# Patient Record
Sex: Female | Born: 1965
Health system: Southern US, Community
[De-identification: ages and names within clinical notes are randomized; demographics above are authoritative.]

## PROBLEM LIST (undated history)

## (undated) DIAGNOSIS — R5383 Other fatigue: Secondary | ICD-10-CM

## (undated) DIAGNOSIS — M818 Other osteoporosis without current pathological fracture: Secondary | ICD-10-CM

## (undated) DIAGNOSIS — D4709 Other mast cell neoplasms of uncertain behavior: Secondary | ICD-10-CM

## (undated) DIAGNOSIS — F419 Anxiety disorder, unspecified: Secondary | ICD-10-CM

## (undated) DIAGNOSIS — R51 Headache: Secondary | ICD-10-CM

## (undated) DIAGNOSIS — Z87892 Personal history of anaphylaxis: Secondary | ICD-10-CM

## (undated) DIAGNOSIS — I7300 Raynaud's syndrome without gangrene: Secondary | ICD-10-CM

## (undated) DIAGNOSIS — Z720 Tobacco use: Secondary | ICD-10-CM

## (undated) DIAGNOSIS — Z9109 Other allergy status, other than to drugs and biological substances: Secondary | ICD-10-CM

## (undated) DIAGNOSIS — T387X5A Adverse effect of androgens and anabolic congeners, initial encounter: Secondary | ICD-10-CM

## (undated) DIAGNOSIS — L719 Rosacea, unspecified: Secondary | ICD-10-CM

## (undated) DIAGNOSIS — Z91038 Other insect allergy status: Secondary | ICD-10-CM

## (undated) DIAGNOSIS — K219 Gastro-esophageal reflux disease without esophagitis: Secondary | ICD-10-CM

## (undated) DIAGNOSIS — Z86010 Personal history of colonic polyps: Secondary | ICD-10-CM

## (undated) DIAGNOSIS — J45909 Unspecified asthma, uncomplicated: Secondary | ICD-10-CM

## (undated) DIAGNOSIS — K50814 Crohn's disease of both small and large intestine with abscess: Secondary | ICD-10-CM

## (undated) DIAGNOSIS — E039 Hypothyroidism, unspecified: Secondary | ICD-10-CM

## (undated) DIAGNOSIS — Z87440 Personal history of urinary (tract) infections: Secondary | ICD-10-CM

## (undated) HISTORY — DX: Other insect allergy status: Z91.038

## (undated) HISTORY — DX: Other fatigue: R53.83

## (undated) HISTORY — DX: Other osteoporosis without current pathological fracture: M81.8

## (undated) HISTORY — DX: Raynaud's syndrome without gangrene: I73.00

## (undated) HISTORY — DX: Personal history of colonic polyps: Z86.010

## (undated) HISTORY — DX: Anxiety disorder, unspecified: F41.9

## (undated) HISTORY — DX: Tobacco use: Z72.0

## (undated) HISTORY — DX: Unspecified asthma, uncomplicated: J45.909

## (undated) HISTORY — DX: Personal history of anaphylaxis: Z87.892

## (undated) HISTORY — DX: Headache: R51

## (undated) HISTORY — DX: Rosacea, unspecified: L71.9

## (undated) HISTORY — DX: Adverse effect of androgens and anabolic congeners, initial encounter: T38.7X5A

## (undated) HISTORY — DX: Crohn's disease of both small and large intestine with abscess: K50.814

## (undated) HISTORY — PX: SHOULDER SURGERY: SHX246

## (undated) HISTORY — DX: Personal history of urinary (tract) infections: Z87.440

## (undated) HISTORY — DX: Other allergy status, other than to drugs and biological substances: Z91.09

## (undated) HISTORY — DX: Hypothyroidism, unspecified: E03.9

---

## 1998-04-28 DIAGNOSIS — F418 Other specified anxiety disorders: Secondary | ICD-10-CM | POA: Insufficient documentation

## 1998-12-28 DIAGNOSIS — L409 Psoriasis, unspecified: Secondary | ICD-10-CM | POA: Insufficient documentation

## 2003-04-29 DIAGNOSIS — Z720 Tobacco use: Secondary | ICD-10-CM

## 2003-04-29 DIAGNOSIS — F17200 Nicotine dependence, unspecified, uncomplicated: Secondary | ICD-10-CM | POA: Insufficient documentation

## 2003-04-29 HISTORY — DX: Tobacco use: Z72.0

## 2005-04-28 DIAGNOSIS — K50814 Crohn's disease of both small and large intestine with abscess: Secondary | ICD-10-CM | POA: Insufficient documentation

## 2005-04-28 HISTORY — DX: Crohn's disease of both small and large intestine with abscess: K50.814

## 2006-02-23 DIAGNOSIS — J45909 Unspecified asthma, uncomplicated: Secondary | ICD-10-CM | POA: Insufficient documentation

## 2006-02-23 DIAGNOSIS — Z9109 Other allergy status, other than to drugs and biological substances: Secondary | ICD-10-CM | POA: Insufficient documentation

## 2006-02-23 DIAGNOSIS — M818 Other osteoporosis without current pathological fracture: Secondary | ICD-10-CM

## 2006-02-23 DIAGNOSIS — M810 Age-related osteoporosis without current pathological fracture: Secondary | ICD-10-CM | POA: Insufficient documentation

## 2006-02-23 HISTORY — DX: Other allergy status, other than to drugs and biological substances: Z91.09

## 2006-02-23 HISTORY — DX: Unspecified asthma, uncomplicated: J45.909

## 2006-02-23 HISTORY — DX: Other osteoporosis without current pathological fracture: M81.8

## 2008-12-27 DIAGNOSIS — M3500 Sicca syndrome, unspecified: Secondary | ICD-10-CM | POA: Insufficient documentation

## 2009-09-25 ENCOUNTER — Inpatient Hospital Stay: Payer: Self-pay | Admitting: Psychiatry

## 2009-11-12 ENCOUNTER — Ambulatory Visit (HOSPITAL_COMMUNITY): Admission: RE | Admit: 2009-11-12 | Discharge: 2009-11-12 | Payer: Self-pay | Admitting: Orthopaedic Surgery

## 2010-05-19 ENCOUNTER — Encounter: Payer: Self-pay | Admitting: Orthopaedic Surgery

## 2010-06-27 ENCOUNTER — Encounter (INDEPENDENT_AMBULATORY_CARE_PROVIDER_SITE_OTHER): Payer: Self-pay | Admitting: *Deleted

## 2010-07-04 NOTE — Letter (Signed)
Summary: New Patient letter  [facility]  [address] [place], [place] [address]   Phone: [phone]  Fax: [phone]       06/27/2010 MRN: [id number]  [facility] [address] [place], [place]  [address]  Dear Ms. Mayford Knife,  Welcome to the Gastroenterology Division at [facility].    You are scheduled to see Dr.  Marina Goodell on 08-07-10 at 1:45p.m. on the 3rd floor at [facility], [address]  We ask that you try to arrive at our office 15 minutes prior to your appointment time to allow for check-in.  We would like you to complete the enclosed self-administered evaluation form prior to your visit and bring it with you on the day of your appointment.  We will review it with you.  Also, please bring a complete list of all your medications or, if you prefer, bring the medication bottles and we will list them.  Please bring your insurance card so that we may make a copy of it.  If your insurance requires a referral to see a specialist, please bring your referral form from your primary care physician.  Co-payments are due at the time of your visit and may be paid by cash, check or credit card.     Your office visit will consist of a consult with your physician (includes a physical exam), any laboratory testing he/she may order, scheduling of any necessary diagnostic testing (e.g. x-ray, ultrasound, CT-scan), and scheduling of a procedure (e.g. Endoscopy, Colonoscopy) if required.  Please allow enough time on your schedule to allow for any/all of these possibilities.    If you cannot keep your appointment, please call [phone] to cancel or reschedule prior to your appointment date.  This allows [place] the opportunity to schedule an appointment for another patient in need of care.  If you do not cancel or reschedule by 5 p.m. the business day prior to your appointment date, you will be charged a $50.00 late cancellation/no-show fee.    Thank you for  choosing [facility] for your medical needs.  We appreciate the opportunity to care for you.  Please visit [place] at our website  to learn more about our practice.                     Sincerely,                                                             The Gastroenterology Division

## 2010-08-07 ENCOUNTER — Ambulatory Visit: Payer: Self-pay | Admitting: Internal Medicine

## 2010-09-09 ENCOUNTER — Ambulatory Visit: Payer: Self-pay | Admitting: Internal Medicine

## 2010-10-24 ENCOUNTER — Ambulatory Visit: Payer: Self-pay | Admitting: Internal Medicine

## 2010-11-05 ENCOUNTER — Emergency Department: Payer: Self-pay | Admitting: Emergency Medicine

## 2010-12-19 ENCOUNTER — Encounter: Payer: Self-pay | Admitting: Internal Medicine

## 2010-12-19 ENCOUNTER — Telehealth: Payer: Self-pay | Admitting: Internal Medicine

## 2010-12-19 NOTE — Telephone Encounter (Signed)
Called pt. and informed of Dr. Blanch Media decision. Sch'd NP OV for 01-14-11 @ 3:30. Mailed paperwork.

## 2010-12-19 NOTE — Telephone Encounter (Signed)
Ok w/ me. Thanks for checking with me first

## 2011-01-14 ENCOUNTER — Ambulatory Visit (INDEPENDENT_AMBULATORY_CARE_PROVIDER_SITE_OTHER): Payer: 59 | Admitting: Internal Medicine

## 2011-01-14 ENCOUNTER — Encounter: Payer: Self-pay | Admitting: Internal Medicine

## 2011-01-14 DIAGNOSIS — K589 Irritable bowel syndrome without diarrhea: Secondary | ICD-10-CM

## 2011-01-14 DIAGNOSIS — Z1211 Encounter for screening for malignant neoplasm of colon: Secondary | ICD-10-CM

## 2011-01-14 DIAGNOSIS — Z86010 Personal history of colonic polyps: Secondary | ICD-10-CM

## 2011-01-14 MED ORDER — PEG-KCL-NACL-NASULF-NA ASC-C 100 G PO SOLR: 1.0000 | Freq: Once | ORAL | Status: DC

## 2011-01-14 NOTE — Progress Notes (Signed)
HISTORY OF PRESENT ILLNESS:  Dawn [name] is a 45 y.o. female who presents today to establish her GI care. Her family physician, is Dr. Mila Merry in [place]. She is referred to me by a mutual patient, and her work Animator, Mickeal Needy. Patient's previous GI care was provided in [facility]. As well, she reports having had visits to [facility] and the [facility]. Unfortunately, no medical records. Her story is a bit unusual. She tells me that she has been diagnosed with food allergies about 15 years ago. These food allergies result in rash and a myriad of GI symptoms and at one point "caused adhesions that almost needed surgery". She also tells me that she has been diagnosed with ulcerative colitis, though she is on no chronic IBD meds, and irritable bowel syndrome. Does tell me that she has taken prednisone sporadically, but fairly frequently over the past 15 years. Currently on 5 mg of prednisone daily. Finally, she mentions that she underwent colonoscopy and upper endoscopy about 15 years ago. Colonoscopy revealed "polyps" for which it was recommended that she undergo colonoscopy YEARLY. These exams were apparently being performed in [place]. She denies having had multiple polyps, a malignant polyp, or a family history of colon cancer in a first degree relative. She reports a significant cancer history and her family including gallbladder cancer in her mother, prostate cancer in her father, and colon cancer in a grandmother. Her visits to [facility] and the [facility] did not elucidate additional specific diagnoses. Her recollection of those evaluations is vague. She tells that she had difficulties with conscious sedation being inadequate. Her last colonoscopy was apparently 5 years ago. She states she is overdue for followup. Thankfully, no active GI complaints at this time. She has not had anaphylaxis related to foods.  REVIEW OF SYSTEMS:  All non-GI ROS negative except for headaches,  skin rash, food intolerance, excessive urination, excessive urinary frequency history of UTI  Past Medical History  Diagnosis Date  . Anxiety   . Asthma   . Hx of colonic polyps   . Ulcerative colitis   . Hx: UTI (urinary tract infection)     Past Surgical History  Procedure Date  . Shoulder surgery     Social History Vasilisa Vore  reports that she has quit smoking. She has never used smokeless tobacco. She reports that she does not drink alcohol or use illicit drugs.  family history includes Cancer in her mother; Colon cancer in her paternal grandmother; Heart disease in her father; Irritable bowel syndrome in her mother; and Prostate cancer in her father.  Allergies  Allergen Reactions  . Erythromycin   . Other     ANTICHOLINERGIC MEDICATIONS   . Penicillins   . Sugar-Protein-Starch   . Sulfa Drugs Cross Reactors        PHYSICAL EXAMINATION:Vital signs: BP 122/74  Pulse 60  Ht 5\' 5"  (1.651 m)  Wt 156 lb (70.761 kg)  BMI 25.96 kg/m2  Constitutional: generally well-appearing, no acute distress Psychiatric: alert and oriented x3, cooperative Eyes: extraocular movements intact, anicteric, conjunctiva pink Mouth: oral pharynx moist, no lesions Neck: supple no lymphadenopathy Cardiovascular: heart regular rate and rhythm, no murmur Lungs: clear to auscultation bilaterally Abdomen: soft, nontender, nondistended, no obvious ascites, no peritoneal signs, normal bowel sounds, no organomegaly Rectal: Deferred until colonoscopy Extremities: no lower extremity edema bilaterally Skin: no lesions on visible extremities Neuro: No focal deficits. No asterixis.    ASSESSMENT:  #1. Prior history of colon polyps. Type  unspecified. Last examination 5 years ago. Apparently due for followup #2. Failed moderate sedation previously #3. Question IBD #4. Question IBS   PLAN:  #1. Obtain outside records for review. Patient has signed a waiver #2. Surveillance colonoscopy.The  nature of the procedure, as well as the risks, benefits, and alternatives were carefully and thoroughly reviewed with the patient. Ample time for discussion and questions allowed. The patient understood, was satisfied, and agreed to proceed. Movi prep prescribed #3. Propofol sedation

## 2011-01-14 NOTE — Patient Instructions (Signed)
Colon LEC with Propoful 02/18/11 9:00 am arrive at 8:00 am  Moviprep sent to pharmacy Colon brochure given for you to read.

## 2011-02-18 ENCOUNTER — Encounter: Payer: 59 | Admitting: Internal Medicine

## 2013-05-31 LAB — LIPID PANEL
Cholesterol: 201 mg/dL — AB (ref 0–200)
HDL: 66 mg/dL (ref 35–70)
LDL Cholesterol: 108 mg/dL
TRIGLYCERIDES: 136 mg/dL (ref 40–160)

## 2013-08-09 ENCOUNTER — Institutional Professional Consult (permissible substitution): Payer: 59 | Admitting: Internal Medicine

## 2014-02-03 LAB — BASIC METABOLIC PANEL
BUN: 15 mg/dL (ref 4–21)
Creatinine: 0.7 mg/dL (ref 0.5–1.1)
Glucose: 90 mg/dL
POTASSIUM: 4.7 mmol/L (ref 3.4–5.3)
SODIUM: 136 mmol/L — AB (ref 137–147)

## 2014-02-03 LAB — CBC AND DIFFERENTIAL: WBC: 10 10*3/mL

## 2014-02-03 LAB — HEPATIC FUNCTION PANEL
ALT: 9 U/L (ref 7–35)
AST: 18 U/L (ref 13–35)

## 2014-02-03 LAB — TSH: TSH: 6.7 u[IU]/mL — AB (ref 0.41–5.90)

## 2014-02-23 ENCOUNTER — Encounter (INDEPENDENT_AMBULATORY_CARE_PROVIDER_SITE_OTHER): Payer: Self-pay

## 2014-02-23 ENCOUNTER — Ambulatory Visit (INDEPENDENT_AMBULATORY_CARE_PROVIDER_SITE_OTHER)
Admission: RE | Admit: 2014-02-23 | Discharge: 2014-02-23 | Disposition: A | Discharge status: Home / Self Care | Payer: BC Managed Care – PPO | Source: Ambulatory Visit | Attending: Internal Medicine | Admitting: Internal Medicine

## 2014-02-23 ENCOUNTER — Encounter: Payer: Self-pay | Admitting: Internal Medicine

## 2014-02-23 ENCOUNTER — Other Ambulatory Visit (INDEPENDENT_AMBULATORY_CARE_PROVIDER_SITE_OTHER): Payer: BC Managed Care – PPO

## 2014-02-23 ENCOUNTER — Ambulatory Visit (INDEPENDENT_AMBULATORY_CARE_PROVIDER_SITE_OTHER): Payer: BC Managed Care – PPO | Admitting: Internal Medicine

## 2014-02-23 VITALS — BP 138/76 | HR 96 | Ht 66.0 in | Wt 158.0 lb

## 2014-02-23 DIAGNOSIS — J4541 Moderate persistent asthma with (acute) exacerbation: Secondary | ICD-10-CM

## 2014-02-23 DIAGNOSIS — Z91038 Other insect allergy status: Secondary | ICD-10-CM

## 2014-02-23 DIAGNOSIS — J4540 Moderate persistent asthma, uncomplicated: Secondary | ICD-10-CM

## 2014-02-23 LAB — CBC WITH DIFFERENTIAL/PLATELET
Basophils Absolute: 0.1 10*3/uL (ref 0.0–0.1)
Basophils Relative: 0.6 % (ref 0.0–3.0)
Eosinophils Absolute: 0.1 10*3/uL (ref 0.0–0.7)
Eosinophils Relative: 1.3 % (ref 0.0–5.0)
HEMATOCRIT: 39.2 % (ref 36.0–46.0)
HEMOGLOBIN: 12.8 g/dL (ref 12.0–15.0)
LYMPHS ABS: 3.2 10*3/uL (ref 0.7–4.0)
Lymphocytes Relative: 38.4 % (ref 12.0–46.0)
MCHC: 32.7 g/dL (ref 30.0–36.0)
MCV: 88 fl (ref 78.0–100.0)
MONOS PCT: 6.4 % (ref 3.0–12.0)
Monocytes Absolute: 0.5 10*3/uL (ref 0.1–1.0)
NEUTROS ABS: 4.5 10*3/uL (ref 1.4–7.7)
Neutrophils Relative %: 53.3 % (ref 43.0–77.0)
Platelets: 320 10*3/uL (ref 150.0–400.0)
RBC: 4.46 Mil/uL (ref 3.87–5.11)
RDW: 16.2 % — AB (ref 11.5–15.5)
WBC: 8.4 10*3/uL (ref 4.0–10.5)

## 2014-02-23 NOTE — Progress Notes (Signed)
02/23/14- 83 yoF former smokerFOLLOWS FOR: Patient here to establish care. Patient has occasional sob, wheezing, chest tightness.   Gives history of asthma since a teenager.no hospital stays. Treated in the past at [facility] in [place]. Wheezing and shortness of breath are intermittent. She blames living in a dusty house near a lot of woods. History of osteopenia so she wants to minimize steroids. Allergy skin testing in the past was positive for dust and pollens. She finds triggers also include exposure to cats and some dogs. History of anaphylaxis from a dilating eyedrops. Also history of strong insect venom systemic allergy so she carries an EpiPen . Some foods of caused hives in the past. Followed by GI in [place] for ulcerative colitis and irritable bowel disease treated with prednisone tapers. History of esophagitis without stricture or eosinophilic changes. She avoids pineapple, salmon, bananas Environment: House, 2 dogs, no smokers, no basement, carpeted, central air conditioning, electric heat, no mold.  Prior to Admission medications   Medication Sig Start Date End Date Taking? Authorizing Provider  albuterol (PROVENTIL HFA;VENTOLIN HFA) 108 (90 BASE) MCG/ACT inhaler Inhale 2 puffs into the lungs every 6 (six) hours as needed for wheezing or shortness of breath.   Yes Historical Provider, MD  CALCIUM-MAGNESIUM-VITAMIN D PO Take 1 capsule by mouth 2 (two) times daily.     Yes Historical Provider, MD  EPINEPHrine 0.3 mg/0.3 mL IJ SOAJ injection Inject 0.3 mg into the muscle as needed.   Yes Historical Provider, MD  LORazepam (ATIVAN) 0.5 MG tablet Take 0.5 mg by mouth 2 (two) times daily.     Yes Historical Provider, MD  predniSONE (DELTASONE) 5 MG tablet Take 5 mg by mouth daily.     Yes Historical Provider, MD   Past Medical History  Diagnosis Date  . Anxiety   . Asthma   . Hx of colonic polyps   . Ulcerative colitis   . Hx: UTI (urinary tract infection)    Past Surgical  History  Procedure Laterality Date  . Shoulder surgery     Family History  Problem Relation Age of Onset  . Colon cancer Paternal Grandmother   . Cancer Mother     gallbladder  . Irritable bowel syndrome Mother   . Prostate cancer Father   . Heart disease Father    History   Social History  . Marital Status: Married    Spouse Name: N/A    Number of Children: 0  . Years of Education: N/A   Occupational History  . Inpatient Coder    Social History Main Topics  . Smoking status: Former Smoker -- 0.30 packs/day for 10 years    Types: Cigarettes  . Smokeless tobacco: Never Used  . Alcohol Use: No  . Drug Use: No  . Sexual Activity: Not on file   Other Topics Concern  . Not on file   Social History Narrative   ROS-see HPI Constitutional:   No-   weight loss, night sweats, fevers, chills, fatigue, lassitude. HEENT:   +headaches, difficulty swallowing, tooth/dental problems, +sore throat,       No-  sneezing, +itching, ear ache, +nasal congestion, post nasal drip,  CV:  No-   chest pain, orthopnea, PND, swelling in lower extremities, anasarca,                                  dizziness, palpitations Resp: +shortness of breath with exertion or at  rest.              No-   productive cough,  No non-productive cough,  No- coughing up of blood.              No-   change in color of mucus.  No- wheezing.   Skin: No-   rash or lesions. GI:  No-   heartburn, indigestion, abdominal pain, nausea, vomiting, diarrhea,                 change in bowel habits, loss of appetite GU: No-   dysuria, change in color of urine, no urgency or frequency.  No- flank pain. MS:  No-   joint pain or swelling.  No- decreased range of motion.  No- back pain. Neuro-     nothing unusual Psych:  No- change in mood or affect. No depression or anxiety.  No memory loss.  OBJ- Physical Exam General- Alert, Oriented, Affect-appropriate, Distress- none acute Skin-keratosis Pilaris especially backs of  arms Lymphadenopathy- none Head- atraumatic            Eyes- Gross vision intact, PERRLA, conjunctivae and secretions clear            Ears- Hearing, canals-normal            Nose- Clear, no-Septal dev, mucus, polyps, erosion, perforation             Throat- Mallampati II , mucosa clear , drainage- none, tonsils- atrophic Neck- flexible , trachea midline, no stridor , thyroid nl, carotid no bruit Chest - symmetrical excursion , unlabored           Heart/CV- RRR , no murmur , no gallop  , no rub, nl s1 s2                           - JVD- none , edema- none, stasis changes- none, varices- none           Lung- clear to P&A, wheeze- none, cough- none , dullness-none, rub- none           Chest wall-  Abd- tender-no, distended-no, bowel sounds-present, HSM- no Br/ Gen/ Rectal- Not done, not indicated Extrem- cyanosis- none, clubbing, none, atrophy- none, strength- nl Neuro- grossly intact to observation

## 2014-02-23 NOTE — Patient Instructions (Signed)
Order- CXR   Dx Asthma moderate uncomplicated              Lab - CBC w diff, Allergy profile, Food IgE profile, alpha-gal IgE       Try a saline nasal gel and/ or saline spray as needed for nasal dryness

## 2014-02-24 LAB — ALLERGEN FOOD PROFILE SPECIFIC IGE
Apple: <0.1 kU/L
Chicken IgE: <0.1 kU/L
Egg White IgE: <0.1 kU/L
Fish Cod: <0.1 kU/L
IgE (Immunoglobulin E), Serum: 14 kU/L (ref <115)
Milk IgE: <0.1 kU/L
Orange: <0.1 kU/L
Peanut IgE: <0.1 kU/L
Shrimp IgE: <0.1 kU/L
Soybean IgE: <0.1 kU/L
Tomato IgE: <0.1 kU/L
Tuna IgE: <0.1 kU/L

## 2014-02-24 LAB — ALLERGY FULL PROFILE
ALTERNARIA ALTERNATA: 0.19 kU/L — AB
Allergen, D pternoyssinus,d7: 0.16 kU/L — ABNORMAL HIGH
Allergen,Goose feathers, e70: <0.1 kU/L
BAHIA GRASS: 1.24 kU/L — AB
Bermuda Grass: 0.72 kU/L — ABNORMAL HIGH
Box Elder IgE: <0.1 kU/L
CAT DANDER: 0.12 kU/L — AB
Candida Albicans: <0.1 kU/L
Common Ragweed: <0.1 kU/L
Curvularia lunata: <0.1 kU/L
D. farinae: 0.16 kU/L — ABNORMAL HIGH
Dog Dander: <0.1 kU/L
Elm IgE: <0.1 kU/L
Fescue: 0.81 kU/L — ABNORMAL HIGH
G005 Rye, Perennial: 0.78 kU/L — ABNORMAL HIGH
G009 Red Top: 0.97 kU/L — ABNORMAL HIGH
House Dust Hollister: <0.1 kU/L
Lamb's Quarters: <0.1 kU/L
Oak: <0.1 kU/L
Plantain: <0.1 kU/L
Stemphylium Botryosum: <0.1 kU/L
Sycamore Tree: <0.1 kU/L
Timothy Grass: 0.79 kU/L — ABNORMAL HIGH

## 2014-02-24 NOTE — Progress Notes (Signed)
Quick Note:  Called and spoke to pt. Informed pt of the results and recs per CY. Pt verbalized understanding and denied any further questions or concerns at this time. ______

## 2014-02-26 DIAGNOSIS — Z91038 Other insect allergy status: Secondary | ICD-10-CM | POA: Insufficient documentation

## 2014-02-26 DIAGNOSIS — J45909 Unspecified asthma, uncomplicated: Secondary | ICD-10-CM | POA: Insufficient documentation

## 2014-02-26 HISTORY — DX: Other insect allergy status: Z91.038

## 2014-02-26 HISTORY — DX: Unspecified asthma, uncomplicated: J45.909

## 2014-02-26 NOTE — Assessment & Plan Note (Signed)
Emphasized avoidance

## 2014-02-26 NOTE — Assessment & Plan Note (Signed)
Chronic persistent asthma, currently uncomplicated. We need to better understand the role of allergic triggers Plan-chest x-ray, allergy profiles, schedule PFT, CBC with differential

## 2014-03-02 LAB — GALACTOSE-ALPHA-1,3-GALACTOSE IGE

## 2014-05-01 ENCOUNTER — Other Ambulatory Visit: Payer: BLUE CROSS/BLUE SHIELD

## 2014-05-01 ENCOUNTER — Ambulatory Visit: Payer: BLUE CROSS/BLUE SHIELD | Admitting: Internal Medicine

## 2014-05-01 ENCOUNTER — Encounter: Payer: Self-pay | Admitting: Internal Medicine

## 2014-05-01 VITALS — BP 122/90 | HR 79 | Ht 65.0 in | Wt 158.2 lb

## 2014-05-01 DIAGNOSIS — K51919 Ulcerative colitis, unspecified with unspecified complications: Secondary | ICD-10-CM

## 2014-05-01 DIAGNOSIS — J45909 Unspecified asthma, uncomplicated: Secondary | ICD-10-CM

## 2014-05-01 NOTE — Progress Notes (Signed)
02/23/14- 64 yoF former smokerFOLLOWS FOR: Patient here to establish care. Patient has occasional sob, wheezing, chest tightness.   Gives history of asthma since a teenager.no hospital stays. Treated in the past at [facility] in [place]. Wheezing and shortness of breath are intermittent. She blames living in a dusty house near a lot of woods. History of osteopenia so she wants to minimize steroids. Allergy skin testing in the past was positive for dust and pollens. She finds triggers also include exposure to cats and some dogs. History of anaphylaxis from a dilating eyedrops. Also history of strong insect venom systemic allergy so she carries an EpiPen . Some foods of caused hives in the past. Followed by GI in [place] for ulcerative colitis and irritable bowel disease treated with prednisone tapers. History of esophagitis without stricture or eosinophilic changes. She avoids pineapple, salmon, bananas Environment: House, 2 dogs, no smokers, no basement, carpeted, central air conditioning, electric heat, no mold.  05/01/13- 71 yoF former smoker followed for asthma/ COPD FOLLOWS FOR: Pt states that she had one flare with weather change- stable after increasing Prednisone(x1 day).  CXR 02/23/14 IMPRESSION: No acute abnormalities. Probable mild pectus excavatum. Electronically Signed  By: Lavonia Dana M.D.  On: 02/23/2014 17:16  ROS-see HPI Constitutional:   No-   weight loss, night sweats, fevers, chills, fatigue, lassitude. HEENT:   +headaches, difficulty swallowing, tooth/dental problems, +sore throat,       No-  sneezing, +itching, ear ache, +nasal congestion, post nasal drip,  CV:  No-   chest pain, orthopnea, PND, swelling in lower extremities, anasarca,                                  dizziness, palpitations Resp: +shortness of breath with exertion or at rest.              No-   productive cough,  No non-productive cough,  No- coughing up of blood.              No-   change  in color of mucus.  No- wheezing.   Skin: No-   rash or lesions. GI:  No-   heartburn, indigestion, abdominal pain, nausea, vomiting, diarrhea,                 change in bowel habits, loss of appetite GU: No-   dysuria, change in color of urine, no urgency or frequency.  No- flank pain. MS:  No-   joint pain or swelling.  No- decreased range of motion.  No- back pain. Neuro-     nothing unusual Psych:  No- change in mood or affect. No depression or anxiety.  No memory loss.  OBJ- Physical Exam General- Alert, Oriented, Affect-appropriate, Distress- none acute Skin-keratosis Pilaris especially backs of arms Lymphadenopathy- none Head- atraumatic            Eyes- Gross vision intact, PERRLA, conjunctivae and secretions clear            Ears- Hearing, canals-normal            Nose- Clear, no-Septal dev, mucus, polyps, erosion, perforation             Throat- Mallampati II , mucosa clear , drainage- none, tonsils- atrophic Neck- flexible , trachea midline, no stridor , thyroid nl, carotid no bruit Chest - symmetrical excursion , unlabored           Heart/CV- RRR ,  no murmur , no gallop  , no rub, nl s1 s2                           - JVD- none , edema- none, stasis changes- none, varices- none           Lung- clear to P&A, wheeze- none, cough- none , dullness-none, rub- none           Chest wall-  Abd- tender-no, distended-no, bowel sounds-present, HSM- no Br/ Gen/ Rectal- Not done, not indicated Extrem- cyanosis- none, clubbing, none, atrophy- none, strength- nl Neuro- grossly intact to observation

## 2014-05-01 NOTE — Patient Instructions (Signed)
Order-lab- Immunoglobulins  IgG, IgA, IgM      Dx asthma, colitis  We can talk more about Grasstek sublingual therapy for grass pollen allergy

## 2014-05-02 LAB — IGG, IGA, IGM
IgA: 178 mg/dL (ref 69–380)
IgG (Immunoglobin G), Serum: 1060 mg/dL (ref 690–1700)
IgM, Serum: 152 mg/dL (ref 52–322)

## 2014-07-13 ENCOUNTER — Ambulatory Visit: Payer: Self-pay | Admitting: Gastroenterology

## 2014-07-13 LAB — HM COLONOSCOPY

## 2014-07-31 ENCOUNTER — Ambulatory Visit: Payer: BLUE CROSS/BLUE SHIELD | Admitting: Internal Medicine

## 2014-11-24 ENCOUNTER — Other Ambulatory Visit: Payer: Self-pay | Admitting: Family Medicine

## 2014-11-24 NOTE — Telephone Encounter (Signed)
Pt contacted office for refill request on the following medications:  CALCIUM-MAGNESIUM-VITAMIN D PO. Ward Specality in [facility] [phone], Nicorette Lozenge Compound 32m MWolfhurstand  predniSONE (DELTASONE) 5 MG [facility].  [id number]

## 2014-11-29 MED ORDER — PREDNISONE 5 MG PO TABS: 5.0000 mg | ORAL_TABLET | Freq: Every day | ORAL | Status: DC

## 2014-11-29 NOTE — Telephone Encounter (Signed)
Pt is calling back to follow up on the Rx.  CB#[phone]/MW

## 2014-11-29 NOTE — Telephone Encounter (Signed)
rx for prednisone has been sent to [facility] Please call in prescription for calcium-magnesium-vitamin D. One tablet daily, #30, rf x 5 Nicorette lozenge compound 1mg  every 2 hours as needed, #240, rf x 5.

## 2014-11-30 NOTE — Telephone Encounter (Signed)
Rx phoned into pharmacy.

## 2014-12-11 ENCOUNTER — Encounter: Payer: Self-pay | Admitting: Family Medicine

## 2014-12-11 ENCOUNTER — Ambulatory Visit (INDEPENDENT_AMBULATORY_CARE_PROVIDER_SITE_OTHER): Payer: BLUE CROSS/BLUE SHIELD | Admitting: Family Medicine

## 2014-12-11 VITALS — BP 140/80 | HR 92 | Temp 98.6°F | Resp 16 | Wt 161.0 lb

## 2014-12-11 DIAGNOSIS — I7300 Raynaud's syndrome without gangrene: Secondary | ICD-10-CM | POA: Insufficient documentation

## 2014-12-11 DIAGNOSIS — R319 Hematuria, unspecified: Secondary | ICD-10-CM

## 2014-12-11 DIAGNOSIS — R5383 Other fatigue: Secondary | ICD-10-CM

## 2014-12-11 DIAGNOSIS — R531 Weakness: Secondary | ICD-10-CM | POA: Diagnosis not present

## 2014-12-11 DIAGNOSIS — Z87892 Personal history of anaphylaxis: Secondary | ICD-10-CM | POA: Insufficient documentation

## 2014-12-11 DIAGNOSIS — E039 Hypothyroidism, unspecified: Secondary | ICD-10-CM | POA: Insufficient documentation

## 2014-12-11 DIAGNOSIS — R51 Headache: Secondary | ICD-10-CM

## 2014-12-11 DIAGNOSIS — L719 Rosacea, unspecified: Secondary | ICD-10-CM

## 2014-12-11 DIAGNOSIS — R519 Headache, unspecified: Secondary | ICD-10-CM | POA: Insufficient documentation

## 2014-12-11 DIAGNOSIS — M2550 Pain in unspecified joint: Secondary | ICD-10-CM | POA: Insufficient documentation

## 2014-12-11 HISTORY — DX: Headache, unspecified: R51.9

## 2014-12-11 HISTORY — DX: Personal history of anaphylaxis: Z87.892

## 2014-12-11 HISTORY — DX: Other fatigue: R53.83

## 2014-12-11 HISTORY — DX: Raynaud's syndrome without gangrene: I73.00

## 2014-12-11 HISTORY — DX: Hypothyroidism, unspecified: E03.9

## 2014-12-11 HISTORY — DX: Rosacea, unspecified: L71.9

## 2014-12-11 NOTE — Progress Notes (Signed)
Patient: Dawn [name] Female    DOB: 04-30-1965   49 y.o.   MRN: [id number] Visit Date: 12/11/2014  Today's Provider: Lelon Huh, MD   Chief Complaint  Patient presents with  . Hematuria  . Referral    Neurology   Subjective:    Hematuria Irritative symptoms include frequency, nocturia and urgency. Obstructive symptoms do not include incomplete emptying. Associated symptoms include flank pain. Pertinent negatives include no abdominal pain or inability to urinate.   She states she has had several visit to [facility] over the last year with persistent hematuria. She presented to [facility] on several occasions with urinary frequency associate with lower back pain which is intermittent. She states there were leukocytes on u/a on some of those occasions, and that hematuria was always present. Symptoms sometimes resolve with antibiotics, but not always, and did not improve with clindamycin the last few two episodes which were in June and earlier this month. Urine was cultured one time which was negative. Has had no radiologic sudies. She was advised by provider at [facility] to have urology evaluation for persistent hematuria..     She aso reports she has had trouble with eye blurriness, ocular muscle weakness, blurry vision and floaters which is more prominent in left eye than right.  She states Dr. Dawna Part thought these symptoms may be due to neurological issue and recommend neurological evaluation. She also states she has numbness in hands, difficulty getting full breath, extreme weakness even just using a computer mouse makes her arms feel weak. Of interest is that Dr. Dawna Part also diagnosed with Sicca syndrome, and that she is being followed by Dr. Jefm Bryant for psoriatic arthritis and is apparently considering disease modifying drugs.    Patient Care Team    Relationship Specialty Notifications Start End  Birdie Sons, MD PCP - General Family Medicine  01/14/11    Comment: Nicholes Rough., MD Consulting Physician Rheumatology  12/11/14   Philis Kendall, MD Consulting Physician Ophthalmology  12/11/14     Past Medical History  Diagnosis Date  . Anxiety   . Asthma   . Hx of colonic polyps   . Ulcerative colitis   . Hx: UTI (urinary tract infection)    Patient Active Problem List   Diagnosis Date Noted  . Arthralgia 12/11/2014  . Headache 12/11/2014  . History of anaphylaxis 12/11/2014  . Acquired hypothyroidism 12/11/2014  . Raynaud's phenomenon 12/11/2014  . Rosacea 12/11/2014  . Fatigue 12/11/2014  . Asthma, chronic 02/26/2014  . History of insect sting allergy 02/26/2014  . Allergy to pollen 02/23/2006  . Osteoporosis due to androgen therapy 02/23/2006  . Asthma, exogenous 02/23/2006  . Crohn's disease of both small and large intestine with abscess 04/28/2005  . Tobacco abuse 04/29/2003  . Anxiety 04/28/1998     Allergies  Allergen Reactions  . Codeine   . Erythromycin   . Other     ANTICHOLINERGIC MEDICATIONS   . Oxycodone Other (See Comments)  . Oxycodone-Acetaminophen Other (See Comments)  . Penicillins   . Sugar-Protein-Starch   . Sulfa Drugs Cross Reactors    Previous Medications   ALBUTEROL (PROVENTIL HFA;VENTOLIN HFA) 108 (90 BASE) MCG/ACT INHALER    Inhale 2 puffs into the lungs every 6 (six) hours as needed for wheezing or shortness of breath.   CALCIUM-MAGNESIUM-VITAMIN D PO    Take 1 capsule by mouth 2 (two) times daily.     EPINEPHRINE (EPIPEN 2-PAK) 0.3 MG/0.3  ML IJ SOAJ INJECTION    Inject 1 Dose as directed. As needed for anaphylactic reactions   EPINEPHRINE 0.3 MG/0.3 ML IJ SOAJ INJECTION    Inject 0.3 mg into the muscle as needed.   GLUCOSE BLOOD TEST STRIP       LEVOTHYROXINE (SYNTHROID, LEVOTHROID) 50 MCG TABLET    Take 1 tablet by mouth daily.   LORAZEPAM (ATIVAN) 0.5 MG TABLET    Take 0.5 mg by mouth daily.    METRONIDAZOLE (METROGEL) 1 % GEL    Apply 1 application topically 2 (two) times daily.    MULTIPLE VITAMINS-MINERALS (MULTIVITAMIN ADULT PO)    Take 1 tablet by mouth daily.   NICOTINE POLACRILEX (NICORETTE) 2 MG LOZENGE    Take by mouth. (1mg  lozenge) take 1 every 2 hours as needed   PREDNISONE (DELTASONE) 5 MG TABLET    Take 1 tablet (5 mg total) by mouth daily.    Review of Systems  Respiratory: Negative for chest tightness, shortness of breath and wheezing.   Cardiovascular: Negative for chest pain and palpitations.  Gastrointestinal: Negative for abdominal pain.  Genitourinary: Positive for urgency, frequency, hematuria, flank pain and nocturia. Negative for incomplete emptying.  Musculoskeletal: Positive for back pain.  Psychiatric/Behavioral: Positive for sleep disturbance.    Social History  Substance Use Topics  . Smoking status: Former Smoker -- 0.30 packs/day for 10 years    Types: Cigarettes  . Smokeless tobacco: Never Used  . Alcohol Use: 0.0 oz/week    0 Standard drinks or equivalent per week     Comment: occasional   Objective:   BP 140/80 mmHg  Pulse 92  Temp(Src) 98.6 F (37 C) (Oral)  Resp 16  Wt 161 lb (73.029 kg)  SpO2 96%  Physical Exam  General appearance: alert, well developed, well nourished, cooperative and in no distress Head: Normocephalic, without obvious abnormality, atraumatic Lungs: Respirations even and unlabored Extremities: No gross deformities Skin: Skin color, texture, turgor normal. No rashes seen  Psych: Appropriate mood and affect. Neurologic: Mental status: Alert, oriented to person, place, and time, thought content appropriate.      Assessment & Plan:     1. Hematuria Persistent at multiple visits to [facility], and not clearly associated with UTIs. She requests referral to rheumatology  2. Weakness Associated with a number of non-specific neurological symptoms and with history of Sicca and psoriatic arthritis. She reports Dr. Dawna Part and Dr. Jefm Bryant recommend referral to neurology. Will obtain records and set up  referral.        Lelon Huh, MD  [facility]

## 2014-12-28 ENCOUNTER — Ambulatory Visit: Payer: BLUE CROSS/BLUE SHIELD | Admitting: Urology

## 2015-01-12 ENCOUNTER — Ambulatory Visit: Payer: BLUE CROSS/BLUE SHIELD

## 2015-01-16 ENCOUNTER — Encounter: Payer: Self-pay | Admitting: Obstetrics and Gynecology

## 2015-01-16 ENCOUNTER — Ambulatory Visit (INDEPENDENT_AMBULATORY_CARE_PROVIDER_SITE_OTHER): Payer: 59 | Admitting: Obstetrics and Gynecology

## 2015-01-16 VITALS — BP 124/87 | HR 72 | Ht 65.0 in | Wt 157.2 lb

## 2015-01-16 DIAGNOSIS — R310 Gross hematuria: Secondary | ICD-10-CM

## 2015-01-16 LAB — MICROSCOPIC EXAMINATION

## 2015-01-16 LAB — URINALYSIS, COMPLETE
Bilirubin, UA: NEGATIVE
Glucose, UA: NEGATIVE
Ketones, UA: NEGATIVE
NITRITE UA: NEGATIVE
PH UA: 6 (ref 5.0–7.5)
Protein, UA: NEGATIVE
Specific Gravity, UA: 1.01 (ref 1.005–1.030)
UUROB: 0.2 mg/dL (ref 0.2–1.0)

## 2015-01-16 NOTE — Progress Notes (Signed)
01/16/2015 8:02 AM   Dawn [name] 03-03-1966 [id number]  Referring provider: Birdie Sons, MD [address] [place], [place] [address]  Chief Complaint  Patient presents with  . Hematuria    new pt     HPI: Patient is a 49 year old female with a significant history of multiple autoimmune diseases including Crohn's disease psoriasis, Sjogens syndrome, and irritable bowel syndrome. She states that she is also currently undergoing a workup by rheumatology for other issues. She presents today as a [facility] for multiple episodes of microscopic hematuria. She also states that she has noticed some blood when she wipes after urinating.  He states that she has been treated multiple times with antibiotics for urinary tract infections but she is unsure cultures were ever since. She doesn't complain of frequent urination, urgency, urge incontinence and nocturia. He states that she does notice that the symptoms become worse when she is having flareups of her autoimmune diseases.  Patient has also been on prednisone for many years for management of her autoimmune disease. She she has developed subsequent osteopenia and takes high doses of calcium supplementation. She states that her primary care provider was concerned that she may be developing kidney stones as a result of high calcium intake.  She reports that she used to smoke for approximately 5 years but quit 20 years ago.  No history of renal stones.  PMH: Past Medical History  Diagnosis Date  . Anxiety   . Asthma   . Hx of colonic polyps   . Ulcerative colitis   . Hx: UTI (urinary tract infection)   . Asthma, chronic 02/26/2014    Onset as teenager   . Allergy to pollen 02/23/2006  . Asthma, exogenous 02/23/2006  . Crohn's disease of both small and large intestine with abscess 04/28/2005    (Ulcerative Colitis)   . Acquired hypothyroidism 12/11/2014  . Osteoporosis due to androgen therapy 02/23/2006  .  Rosacea 12/11/2014  . History of insect sting allergy 02/26/2014    Hymenoptera, carries EpiPen   . Headache 12/11/2014  . History of anaphylaxis 12/11/2014  . Raynaud's phenomenon 12/11/2014  . Tobacco abuse 04/29/2003  . Fatigue 12/11/2014    Surgical History: Past Surgical History  Procedure Laterality Date  . Shoulder surgery      Home Medications:    Medication List       This list is accurate as of: 01/16/15 11:59 PM.  Always use your most recent med list.               albuterol 108 (90 BASE) MCG/ACT inhaler  Commonly known as:  PROVENTIL HFA;VENTOLIN HFA  Inhale 2 puffs into the lungs every 6 (six) hours as needed for wheezing or shortness of breath.     CALCIUM-MAGNESIUM-VITAMIN D PO  Take 1 capsule by mouth 2 (two) times daily.     EPIPEN 2-PAK 0.3 mg/0.3 mL Soaj injection  Generic drug:  EPINEPHrine  Inject 1 Dose as directed. As needed for anaphylactic reactions     glucose blood test strip     levothyroxine 50 MCG tablet  Commonly known as:  SYNTHROID, LEVOTHROID  Take 1 tablet by mouth daily.     LORazepam 0.5 MG tablet  Commonly known as:  ATIVAN  Take 0.5 mg by mouth daily.     METROGEL 1 % gel  Generic drug:  metroNIDAZOLE  Apply 1 application topically 2 (two) times daily.     MULTIVITAMIN ADULT PO  Take  1 tablet by mouth daily.     NICORETTE 2 MG lozenge  Generic drug:  nicotine polacrilex  Take by mouth. (75m lozenge) take 1 every 2 hours as needed     OConsecoCalcium/Vitamin D 250-125 MG-UNIT Tabs     predniSONE 5 MG tablet  Commonly known as:  DELTASONE  Take 1 tablet (5 mg total) by mouth daily.        Allergies:  Allergies  Allergen Reactions  . Codeine   . Erythromycin   . Other     ANTICHOLINERGIC MEDICATIONS   . Oxycodone Other (See Comments)  . Oxycodone-Acetaminophen Other (See Comments) and Nausea And Vomiting  . Penicillins   . Sugar-Protein-Starch   . Sulfa Drugs Cross Reactors     Family History: Family  History  Problem Relation Age of Onset  . Colon cancer Paternal Grandmother   . Cancer Mother     gallbladder  . Irritable bowel syndrome Mother   . Prostate cancer Father   . Heart disease Father   . Renal cancer Maternal Grandmother     Social History:  reports that she quit smoking about 20 years ago. Her smoking use included Cigarettes. She has a 3 pack-year smoking history. She has never used smokeless tobacco. She reports that she drinks alcohol. She reports that she does not use illicit drugs.  ROS: UROLOGY Frequent Urination?: Yes Hard to postpone urination?: Yes Burning/pain with urination?: No Get up at night to urinate?: Yes Leakage of urine?: Yes Urine stream starts and stops?: No Trouble starting stream?: No Do you have to strain to urinate?: No Blood in urine?: Yes Urinary tract infection?: Yes Sexually transmitted disease?: No Injury to kidneys or bladder?: No Painful intercourse?: No Weak stream?: No Currently pregnant?: No Vaginal bleeding?: No Last menstrual period?: No  Gastrointestinal Nausea?: No Vomiting?: No Indigestion/heartburn?: No Diarrhea?: No Constipation?: No  Constitutional Fever: No Night sweats?: No Weight loss?: No Fatigue?: No  Skin Skin rash/lesions?: No  Eyes Blurred vision?: No Double vision?: No  Ears/Nose/Throat Sore throat?: No Sinus problems?: No  Hematologic/Lymphatic Swollen glands?: No Easy bruising?: No  Cardiovascular Leg swelling?: No Chest pain?: No  Respiratory Cough?: No Shortness of breath?: No  Endocrine Excessive thirst?: No  Musculoskeletal Back pain?: No Joint pain?: No  Neurological Headaches?: No Dizziness?: No  Psychologic Depression?: No Anxiety?: Yes  Physical Exam: BP 124/87 mmHg  Pulse 72  Ht 5' 5"  (1.651 m)  Wt 157 lb 3.2 oz (71.305 kg)  BMI 26.16 kg/m2  Constitutional:  Alert and oriented, No acute distress. HEENT: [place] AT, moist mucus membranes.  Trachea midline,  no masses. Cardiovascular: No clubbing, cyanosis, or edema. Respiratory: Normal respiratory effort, no increased work of breathing. GI: Abdomen is soft, nontender, nondistended, no abdominal masses GU: No CVA tenderness.  Skin: No rashes, bruises or suspicious lesions. Lymph: No cervical or inguinal adenopathy. Neurologic: Grossly intact, no focal deficits, moving all 4 extremities. Psychiatric: Normal mood and affect.  Laboratory Data: Results for orders placed or performed in visit on 01/16/15  Microscopic Examination  Result Value Ref Range   WBC, UA 6-10 (A) 0 -  5 /hpf   RBC, UA 0-2 0 -  2 /hpf   Epithelial Cells (non renal) 0-10 0 - 10 /hpf   Mucus, UA Present (A) Not Estab.   Bacteria, UA Moderate (A) None seen/Few  Urinalysis, Complete  Result Value Ref Range   Specific Gravity, UA 1.010 1.005 - 1.030   pH, UA  6.0 5.0 - 7.5   Color, UA Yellow Yellow   Appearance Ur Clear Clear   Leukocytes, UA 1+ (A) Negative   Protein, UA Negative Negative/Trace   Glucose, UA Negative Negative   Ketones, UA Negative Negative   RBC, UA 2+ (A) Negative   Bilirubin, UA Negative Negative   Urobilinogen, Ur 0.2 0.2 - 1.0 mg/dL   Nitrite, UA Negative Negative   Microscopic Examination See below:   Comprehensive metabolic panel  Result Value Ref Range   Glucose 80 65 - 99 mg/dL   BUN 9 6 - 24 mg/dL   Creatinine, Ser 0.68 0.57 - 1.00 mg/dL   GFR calc non Af Amer 102 >59 mL/min/1.73   GFR calc Af Amer 117 >59 mL/min/1.73   BUN/Creatinine Ratio 13 9 - 23   Sodium 139 134 - 144 mmol/L   Potassium 4.0 3.5 - 5.2 mmol/L   Chloride 97 97 - 108 mmol/L   CO2 25 18 - 29 mmol/L   Calcium 9.6 8.7 - 10.2 mg/dL   Total Protein 7.1 6.0 - 8.5 g/dL   Albumin 4.5 3.5 - 5.5 g/dL   Globulin, Total 2.6 1.5 - 4.5 g/dL   Albumin/Globulin Ratio 1.7 1.1 - 2.5   Bilirubin Total 0.6 0.0 - 1.2 mg/dL   Alkaline Phosphatase 57 39 - 117 IU/L   AST 18 0 - 40 IU/L   ALT 13 0 - 32 IU/L     Urinalysis     Component Value Date/Time   GLUCOSEU Negative 01/16/2015 1017   BILIRUBINUR Negative 01/16/2015 1017   NITRITE Negative 01/16/2015 1017   LEUKOCYTESUR 1+* 01/16/2015 1017    Pertinent Imaging:   Assessment & Plan:    1. Gross hematuria- Patient reports occasionally noticing blood when she wipes after urinating. She has noticed a small amount in the toilet. Patient also reports that she has been told on multiple occasions that she had blood in her urine when she was seen at  [facility]. We do not have notes available to [place] at this time. Considering possible gross hematuria. We discussed the differential diagnosis for gross and microscopic hematuria including nephrolithiasis, renal or upper tract tumors, bladder stones, UTIs, or bladder tumors as well as undetermined etiologies. Per AUA guidelines, I did recommend complete microscopic hematuria evaluation including CTU, possible urine cytology, and office cystoscopy. Patient understands and is in agreement with plan. - Urinalysis, Complete  2. Urinary Frequency- Patient states that symptoms significantly worsened when she is having "flareups" of her autoimmune diseases. She states that she does not wish to take any additional medications at this time. Urine is unremarkable today and I do not feel that these symptoms are related to infection. She reports very mild symptoms today.  Additional notes and/or imaging study requested from [facility]  Return for schedule cystoscopy; f/u visit for CT Urogram results.  These notes generated with voice recognition software. I apologize for typographical errors.  Herbert Moors, [facility] [address], [place], [place] [address] [phone]

## 2015-01-17 LAB — COMPREHENSIVE METABOLIC PANEL
ALBUMIN: 4.5 g/dL (ref 3.5–5.5)
ALK PHOS: 57 IU/L (ref 39–117)
ALT: 13 IU/L (ref 0–32)
AST: 18 IU/L (ref 0–40)
Albumin/Globulin Ratio: 1.7 (ref 1.1–2.5)
BUN / CREAT RATIO: 13 (ref 9–23)
BUN: 9 mg/dL (ref 6–24)
Bilirubin Total: 0.6 mg/dL (ref 0.0–1.2)
CALCIUM: 9.6 mg/dL (ref 8.7–10.2)
CO2: 25 mmol/L (ref 18–29)
CREATININE: 0.68 mg/dL (ref 0.57–1.00)
Chloride: 97 mmol/L (ref 97–108)
GFR calc Af Amer: 117 mL/min/{1.73_m2} (ref >59)
GFR, EST NON AFRICAN AMERICAN: 102 mL/min/{1.73_m2} (ref >59)
GLUCOSE: 80 mg/dL (ref 65–99)
Globulin, Total: 2.6 g/dL (ref 1.5–4.5)
Potassium: 4 mmol/L (ref 3.5–5.2)
Sodium: 139 mmol/L (ref 134–144)
Total Protein: 7.1 g/dL (ref 6.0–8.5)

## 2015-01-19 ENCOUNTER — Telehealth: Payer: Self-pay

## 2015-01-19 NOTE — Telephone Encounter (Signed)
-----   Message from Roda Shutters, [place] sent at 01/18/2015  1:25 PM EDT ----- Please notify patient that her blood work was normal. Thanks

## 2015-01-19 NOTE — Telephone Encounter (Signed)
Spoke with pt in reference to lab results. Pt voiced understanding.  

## 2015-02-14 ENCOUNTER — Other Ambulatory Visit: Payer: 59

## 2015-02-26 ENCOUNTER — Other Ambulatory Visit: Payer: 59

## 2015-03-14 ENCOUNTER — Ambulatory Visit: Payer: 59

## 2015-03-14 ENCOUNTER — Other Ambulatory Visit: Payer: Self-pay | Admitting: *Deleted

## 2015-03-20 ENCOUNTER — Other Ambulatory Visit: Payer: Self-pay | Admitting: Family Medicine

## 2015-03-20 NOTE — Telephone Encounter (Signed)
Pt needs refills .... nicotine polacrilex (NICORETTE) 1 MG lozenge 240 multiple refills she uses [facility] for this   Calcium Carb-Cholecalciferol (OYSTER SHELL CALCIUM/VITAMIN D) 250-125 MG-UNIT TABS 180 monthly  Multiple refills  She uses [facility] for this.  Their number is  [phone]  Patients Call back is [phone]  Thanks teri

## 2015-03-21 MED ORDER — OYSTER SHELL CALCIUM/VITAMIN D 250-125 MG-UNIT PO TABS: 1.0000 | ORAL_TABLET | Freq: Every day | ORAL | Status: DC

## 2015-03-21 NOTE — Telephone Encounter (Signed)
22m nicotine lozenges are not in EMR. Please call in rx for nicotine 176mlozenge, one every 2 hours as needed, #240, rf x 12

## 2015-04-25 ENCOUNTER — Other Ambulatory Visit: Payer: Self-pay | Admitting: Neurology

## 2015-04-25 DIAGNOSIS — H538 Other visual disturbances: Secondary | ICD-10-CM

## 2015-04-26 ENCOUNTER — Telehealth: Payer: Self-pay

## 2015-04-26 NOTE — Telephone Encounter (Signed)
Mickel Baas  From [facility] called stating that she is concerned that patient may be taking to may of the Nicotine Lozenges. The prescriptions has been written for a quantity of 240 which is a 20 day supply. Mickel Baas states that the patient has been calling requesting refills earlier and earlier each week. She is concerned that this is unsafe for the patient because she has been on these since 05/2013 and she feels that the patient should be weaned off instead of needing more. Mickel Baas wants to know if the Lozenges are supposed to last 1 month? If so she want Dr. Caryn Section to put something in the directions where it limits the amount patient is to take daily. Please call Mickel Baas back at [phone]. I advised Mickel Baas that you were out of the office until Tuesday and she says that's fine to wait on your response.

## 2015-05-15 ENCOUNTER — Telehealth: Payer: Self-pay

## 2015-05-15 NOTE — Telephone Encounter (Signed)
Please advise 

## 2015-05-15 NOTE — Telephone Encounter (Signed)
Called in Rx as listed below into pharmacy.

## 2015-05-15 NOTE — Telephone Encounter (Signed)
Patient is requesting a refill for Calcium 100 mg- Vitamin D 100 IU- Magnesium 100 mg be called in at [facility] in [place] [phone] (compounding dept). Added pharmacy in chart also.  Patient states she takes compound vitamin 2 capsules 3 times daily. CB# [phone]

## 2015-05-15 NOTE — Telephone Encounter (Signed)
Ok to call into pharmacy. Medication does not seem to be in Epic. Quantity of 180 and refill x 12

## 2015-05-17 ENCOUNTER — Ambulatory Visit
Admission: RE | Admit: 2015-05-17 | Discharge: 2015-05-17 | Disposition: A | Discharge status: Home / Self Care | Payer: 59 | Source: Ambulatory Visit | Attending: Neurology | Admitting: Neurology

## 2015-05-17 DIAGNOSIS — R531 Weakness: Secondary | ICD-10-CM | POA: Insufficient documentation

## 2015-05-17 DIAGNOSIS — H538 Other visual disturbances: Secondary | ICD-10-CM | POA: Insufficient documentation

## 2015-05-17 DIAGNOSIS — R1319 Other dysphagia: Secondary | ICD-10-CM | POA: Insufficient documentation

## 2015-05-17 DIAGNOSIS — R42 Dizziness and giddiness: Secondary | ICD-10-CM | POA: Diagnosis not present

## 2015-05-17 DIAGNOSIS — R51 Headache: Secondary | ICD-10-CM | POA: Insufficient documentation

## 2015-05-17 MED ORDER — GADOBENATE DIMEGLUMINE 529 MG/ML IV SOLN
15.0000 mL | Freq: Once | INTRAVENOUS | Status: AC | PRN
  Administered 2015-05-17: 14 mL via INTRAVENOUS

## 2015-06-13 HISTORY — PX: ELECTROMYOGRAPHY: SHX933

## 2015-06-14 ENCOUNTER — Encounter: Payer: Self-pay | Admitting: Family Medicine

## 2015-07-05 ENCOUNTER — Other Ambulatory Visit: Payer: Self-pay | Admitting: Neurology

## 2015-07-09 ENCOUNTER — Other Ambulatory Visit: Payer: Self-pay | Admitting: Neurology

## 2015-07-09 DIAGNOSIS — E329 Disease of thymus, unspecified: Secondary | ICD-10-CM

## 2015-07-11 ENCOUNTER — Ambulatory Visit
Admission: RE | Admit: 2015-07-11 | Discharge: 2015-07-11 | Disposition: A | Discharge status: Home / Self Care | Payer: 59 | Source: Ambulatory Visit | Attending: Neurology | Admitting: Neurology

## 2015-07-11 DIAGNOSIS — R918 Other nonspecific abnormal finding of lung field: Secondary | ICD-10-CM | POA: Diagnosis not present

## 2015-07-11 DIAGNOSIS — E329 Disease of thymus, unspecified: Secondary | ICD-10-CM | POA: Insufficient documentation

## 2015-08-16 ENCOUNTER — Telehealth: Payer: Self-pay | Admitting: Family Medicine

## 2015-08-16 NOTE — Telephone Encounter (Signed)
Pt contacted office for refill request on the following medications: 1. predniSONE (DELTASONE) 5 MG tablet to [facility] 2. nicotine polacrilex (NICORETTE) 1 MG lozenge to [facility]  Pt needs both of these medications sent to 2 different pharmacies. Please advise. Thanks TNP

## 2015-08-17 MED ORDER — PREDNISONE 5 MG PO TABS: 5.0000 mg | ORAL_TABLET | Freq: Every day | ORAL | Status: DC

## 2015-08-17 MED ORDER — NICOTINE POLACRILEX 2 MG MT LOZG: 2.0000 mg | LOZENGE | OROMUCOSAL | Status: DC | PRN

## 2015-08-17 NOTE — Addendum Note (Signed)
Addended by: Lelon Huh E on: 08/17/2015 11:09 AM   Modules accepted: Orders

## 2015-08-17 NOTE — Telephone Encounter (Signed)
Can you please call [facility] in [place] and cancel the prescription for nicotine patches. Was supposed to go to [facility] instead. Thanks.

## 2015-10-01 ENCOUNTER — Other Ambulatory Visit: Payer: Self-pay | Admitting: Family Medicine

## 2015-10-01 MED ORDER — ALBUTEROL SULFATE HFA 108 (90 BASE) MCG/ACT IN AERS: 2.0000 | INHALATION_SPRAY | Freq: Four times a day (QID) | RESPIRATORY_TRACT | Status: DC | PRN

## 2015-10-01 MED ORDER — EPINEPHRINE 0.3 MG/0.3ML IJ SOAJ: 0.3000 mg | Freq: Once | INTRAMUSCULAR | Status: DC

## 2015-10-01 NOTE — Telephone Encounter (Signed)
Pt needs refill on the following   EPINEPHrine (EPIPEN 2-PAK) 0.3 mg/0.3 mL IJ SOAJ injection predniSONE (DELTASONE) 5 MG tablet albuterol (PROVENTIL HFA;VENTOLIN HFA) 108 (90 BASE) MCG/ACT inhaler  She uses [facility] in [place]  Her call back is [phone]  Thanks Con Memos

## 2015-10-01 NOTE — Telephone Encounter (Signed)
Prednisone has #6 refills available at [facility].

## 2015-10-26 ENCOUNTER — Telehealth: Payer: Self-pay | Admitting: Family Medicine

## 2015-10-26 ENCOUNTER — Other Ambulatory Visit: Payer: Self-pay | Admitting: *Deleted

## 2015-10-26 ENCOUNTER — Other Ambulatory Visit: Payer: Self-pay | Admitting: Physician Assistant

## 2015-10-26 MED ORDER — NICOTINE POLACRILEX 2 MG MT LOZG: 2.0000 mg | LOZENGE | OROMUCOSAL | Status: DC | PRN

## 2015-10-26 NOTE — Telephone Encounter (Signed)
Pt stated that the medication RX was sent into [facility] today but it was supposed to be for 1 mg instead of 2 mg pt would like it sent to  [facility], [place] [phone] (Phone) [phone] (Fax)       Please advise. Thanks TNP

## 2015-10-26 NOTE — Telephone Encounter (Signed)
Done

## 2015-10-26 NOTE — Telephone Encounter (Signed)
Per Fenton Malling, ok to call [facility] and give rx for 1 mg Nicotine lozenge #240 x1.

## 2015-11-01 ENCOUNTER — Ambulatory Visit (INDEPENDENT_AMBULATORY_CARE_PROVIDER_SITE_OTHER): Payer: BLUE CROSS/BLUE SHIELD | Admitting: Family Medicine

## 2015-11-01 ENCOUNTER — Encounter: Payer: Self-pay | Admitting: Family Medicine

## 2015-11-01 VITALS — BP 110/74 | HR 80 | Temp 97.7°F | Resp 16 | Wt 141.0 lb

## 2015-11-01 DIAGNOSIS — R531 Weakness: Secondary | ICD-10-CM | POA: Diagnosis not present

## 2015-11-01 DIAGNOSIS — R51 Headache: Secondary | ICD-10-CM | POA: Diagnosis not present

## 2015-11-01 DIAGNOSIS — R519 Headache, unspecified: Secondary | ICD-10-CM

## 2015-11-01 DIAGNOSIS — F17299 Nicotine dependence, other tobacco product, with unspecified nicotine-induced disorders: Secondary | ICD-10-CM

## 2015-11-01 NOTE — Progress Notes (Signed)
Patient: Dawn [name] Female    DOB: 31-Aug-1965   50 y.o.   MRN: [id number] Visit Date: 11/01/2015  Today's Provider: Lelon Huh, MD   Chief Complaint  Patient presents with  . Headache  . Rash   Subjective:    HPI  Patient presents today requesting referral to Dr. Elmyra Ricks at [facility]. She has been seeing local neurologist Dr. Manuella Ghazi for constellation of symptoms including recurrent headache, dizziness, episodes of blurred vision, and lower extremity weakness. However she also has a number of suspected auto-immune conditions including Crohn's disease, SICCA syndrome, Sjogren's syndrome, recurrent hives, psoriatic arthritis, and findings of bilateral ground grass opacities on recent CT of her chest ordered by her pulmonologist, Dr. Raul Del. Her rheumatologist, Dr. Jefm Bryant has her on methotrexate.   Patient states she has discussed her conditions with her specialists and decided to see Dr. Gordy Levan because she has heard that Dr. Gordy Levan has experience with Mast cell dysfunction and may be able tie all of her chronic neurologic and rheumatologic conditions together. Patient's last MRI of brain was 05/27/2015 and was unremarkable. She is also followed by Dr. Allen Norris and has had several colonoscopies.    Allergies  Allergen Reactions  . Codeine   . Erythromycin   . Other     ANTICHOLINERGIC MEDICATIONS   . Oxycodone Other (See Comments)  . Oxycodone-Acetaminophen Other (See Comments) and Nausea And Vomiting  . Penicillins   . Sugar-Protein-Starch   . Sulfa Drugs Cross Reactors    Current Meds  Medication Sig  . albuterol (PROVENTIL HFA;VENTOLIN HFA) 108 (90 Base) MCG/ACT inhaler Inhale 2 puffs into the lungs every 6 (six) hours as needed for wheezing or shortness of breath.  Marland Kitchen CALCIUM-MAGNESIUM-VITAMIN D PO Take 1 capsule by mouth 2 (two) times daily.    Marland Kitchen EPINEPHrine (EPIPEN 2-PAK) 0.3 mg/0.3 mL IJ SOAJ injection Inject 0.3 mLs (0.3 mg total) into the skin once. As  needed for anaphylactic reactions  . glucose blood test strip   . levothyroxine (SYNTHROID, LEVOTHROID) 50 MCG tablet Take 1 tablet by mouth daily.  Marland Kitchen LORazepam (ATIVAN) 0.5 MG tablet Take 0.5 mg by mouth daily.   . Multiple Vitamins-Minerals (MULTIVITAMIN ADULT PO) Take 1 tablet by mouth daily.  . nicotine polacrilex 1 MG lozenge Take 1 mg every 2 hours by mouth as needed for smoking cessation.  . predniSONE (DELTASONE) 5 MG tablet Take 1 tablet (5 mg total) by mouth daily.    Review of Systems  Constitutional: Negative for fever, chills, appetite change and fatigue.  Respiratory: Negative for chest tightness and shortness of breath.   Cardiovascular: Negative for chest pain and palpitations.  Gastrointestinal: Negative for nausea, vomiting and abdominal pain.  Neurological: Negative for dizziness and weakness.    Social History  Substance Use Topics  . Smoking status: Former Smoker -- 0.30 packs/day for 10 years    Types: Cigarettes    Quit date: 01/16/1995  . Smokeless tobacco: Never Used  . Alcohol Use: 0.0 oz/week    0 Standard drinks or equivalent per week     Comment: occasional   Patient Active Problem List   Diagnosis Date Noted  . Bilateral headaches 11/01/2015  . Weakness 11/01/2015  . Arthralgia 12/11/2014  . Headache 12/11/2014  . History of anaphylaxis 12/11/2014  . Acquired hypothyroidism 12/11/2014  . Raynaud's phenomenon 12/11/2014  . Rosacea 12/11/2014  . Fatigue 12/11/2014  . Asthma, chronic 02/26/2014  . History of insect sting allergy 02/26/2014  .  Allergy to pollen 02/23/2006  . Osteoporosis due to androgen therapy 02/23/2006  . Asthma, exogenous 02/23/2006  . Crohn's disease of both small and large intestine with abscess ([place]) 04/28/2005  . Nicotine dependence 04/29/2003  . Anxiety 04/28/1998   Patient Care Team    Relationship Specialty Notifications Start End  Birdie Sons, MD PCP - General Family Medicine  01/14/11    Comment: Nicholes Rough., MD Consulting Physician Rheumatology  12/11/14   Philis Kendall, MD Consulting Physician Ophthalmology  12/11/14   Vladimir Crofts, MD  Neurology  11/04/15   Deneise Lever, MD Consulting Physician Pulmonary Disease  11/04/15   Lucilla Lame, MD Consulting Physician Gastroenterology  11/04/15     Objective:   BP 110/74 mmHg  Pulse 80  Temp(Src) 97.7 F (36.5 C) (Oral)  Resp 16  Wt 141 lb (63.957 kg)  SpO2 98%  Physical Exam   General Appearance:    Alert, cooperative, no distress  Eyes:    PERRL, conjunctiva/corneas clear, EOM's intact       Lungs:     Clear to auscultation bilaterally, respirations unlabored  Heart:    Regular rate and rhythm  Neurologic:   Awake, alert, oriented x 3. No apparent focal neurological           defect.               Assessment & Plan:     1. Bilateral headaches Associated with several suspected autoimmune diseases. Per patient request will refer Dr. Gordy Levan who she understands is familiar with mast cell dysfunction as discussed above.  - Ambulatory referral to Neurology. Dr Jackolyn Confer [facility]  2. Weakness  - Ambulatory referral to Neurology  3. Other tobacco product nicotine dependence with nicotine-induced disorder Refill Nicotine lozenges 1mg  every 2 hours as needed. Long discussion regarding potential adverse health effects of excessive nicotine consumption and she is going to limit to a few a day and work on weaning all together.         Lelon Huh, MD  [facility]

## 2015-11-02 DIAGNOSIS — R0609 Other forms of dyspnea: Secondary | ICD-10-CM | POA: Insufficient documentation

## 2016-01-14 ENCOUNTER — Ambulatory Visit (INDEPENDENT_AMBULATORY_CARE_PROVIDER_SITE_OTHER): Payer: BLUE CROSS/BLUE SHIELD | Admitting: Family Medicine

## 2016-01-14 ENCOUNTER — Encounter: Payer: Self-pay | Admitting: Family Medicine

## 2016-01-14 VITALS — BP 110/66 | HR 104 | Temp 98.2°F | Resp 16 | Ht 72.0 in | Wt 141.0 lb

## 2016-01-14 DIAGNOSIS — J029 Acute pharyngitis, unspecified: Secondary | ICD-10-CM | POA: Diagnosis not present

## 2016-01-14 DIAGNOSIS — E039 Hypothyroidism, unspecified: Secondary | ICD-10-CM | POA: Diagnosis not present

## 2016-01-14 LAB — POCT RAPID STREP A (OFFICE): Rapid Strep A Screen: NEGATIVE

## 2016-01-14 MED ORDER — CLINDAMYCIN HCL 300 MG PO CAPS: 300.0000 mg | ORAL_CAPSULE | Freq: Three times a day (TID) | ORAL | 0 refills | Status: DC

## 2016-01-14 NOTE — Progress Notes (Signed)
Patient: Dawn [name] Female    DOB: 06/07/1965   50 y.o.   MRN: [id number] Visit Date: 01/14/2016  Today's Provider: Lelon Huh, MD   Chief Complaint  Patient presents with  . Hypothyroidism   Subjective:    HPI  Hypothyroid, follow-up:  TSH  Date Value Ref Range Status  02/03/2014 6.70 (A) 0.41 - 5.90 uIU/mL Final   Wt Readings from Last 3 Encounters:  01/14/16 141 lb (64 kg)  11/01/15 141 lb (64 kg)  01/16/15 157 lb 3.2 oz (71.3 kg)    She was last seen for hypothyroid 1 years ago.  Management since that visit includes started on Levothyroxine 50 mcg. Patient reports she did not start her medication due to auto-immune problems which in the past had caused her to be hypothyroid. She feels that autoimmune condition is not going to change anytime soon. States she is having dry skin and hair and thinks she may need to go ahead and start.  She reports poor compliance with treatment. She is not having side effects.  She is exercising. She is experiencing dry skin and hair. She denies diarrhea and heat / cold intolerance Weight trend: decreasing steadily  ------------------------------------------------------------------------  Sore Throat: Patient complains of sore throat. Associated symptoms include suspected fevers but not measured at home and sore throat.Onset of symptoms was 3 days ago, unchanged since that time. She is drinking plenty of fluids. She has not had recent close exposure to someone with proven streptococcal pharyngitis.     Allergies  Allergen Reactions  . Codeine   . Erythromycin   . Other     ANTICHOLINERGIC MEDICATIONS   . Oxycodone Other (See Comments)  . Oxycodone-Acetaminophen Other (See Comments) and Nausea And Vomiting  . Penicillins   . Sugar-Protein-Starch   . Sulfa Drugs Cross Reactors      Current Outpatient Prescriptions:  .  albuterol (PROVENTIL HFA;VENTOLIN HFA) 108 (90 Base) MCG/ACT inhaler, Inhale 2 puffs into the  lungs every 6 (six) hours as needed for wheezing or shortness of breath., Disp: 18 g, Rfl: 3 .  CALCIUM-MAGNESIUM-VITAMIN D PO, Take 1 capsule by mouth 2 (two) times daily.  , Disp: , Rfl:  .  EPINEPHrine (EPIPEN 2-PAK) 0.3 mg/0.3 mL IJ SOAJ injection, Inject 0.3 mLs (0.3 mg total) into the skin once. As needed for anaphylactic reactions, Disp: 2 Device, Rfl: 3 .  glucose blood test strip, , Disp: , Rfl:  .  levothyroxine (SYNTHROID, LEVOTHROID) 50 MCG tablet, Take 1 tablet by mouth daily., Disp: , Rfl:  .  LORazepam (ATIVAN) 0.5 MG tablet, Take 0.5 mg by mouth daily. , Disp: , Rfl:  .  metroNIDAZOLE (METROGEL) 1 % gel, Apply 1 application topically 2 (two) times daily. Reported on 11/01/2015, Disp: , Rfl:  .  Multiple Vitamins-Minerals (MULTIVITAMIN ADULT PO), Take 1 tablet by mouth daily., Disp: , Rfl:  .  predniSONE (DELTASONE) 5 MG tablet, Take 1 tablet (5 mg total) by mouth daily., Disp: 30 tablet, Rfl: 5  Review of Systems  Constitutional: Positive for fever.  HENT: Positive for sore throat.   Cardiovascular: Negative.   Endocrine: Negative.     Social History  Substance Use Topics  . Smoking status: Former Smoker    Packs/day: 0.30    Years: 10.00    Types: Cigarettes    Quit date: 01/16/1995  . Smokeless tobacco: Never Used  . Alcohol use 0.0 oz/week     Comment: occasional   Objective:  BP 110/66 (BP Location: Left Arm, Patient Position: Sitting, Cuff Size: Large)   Pulse (!) 104   Temp 98.2 F (36.8 C) (Oral)   Resp 16   Ht 6' (1.829 m)   Wt 141 lb (64 kg)   BMI 19.12 kg/m   Physical Exam  General Appearance:    Alert, cooperative, no distress  HENT:   bilateral TM normal without fluid or infection, neck has bilateral anterior cervical nodes enlarged, pharynx erythematous without exudate and sinuses nontender  Eyes:    PERRL, conjunctiva/corneas clear, EOM's intact       Lungs:     Clear to auscultation bilaterally, respirations unlabored  Heart:    Regular rate  and rhythm  Neurologic:   Awake, alert, oriented x 3. No apparent focal neurological           defect.         Strep Negative.     Assessment & Plan:     .1. Acquired hypothyroidism  - T4 AND TSH  2. Sore throat Likely viral. Recommended gargling with warm salt water every 2-3 hours during the day. She states clindamycin is the antibiotic she tolerating best.Printed rx for 367m TID to start if sx worsening, or if not better in 2-3 weeks.  - POCT rapid strep A       DLelon Huh MD  [facility]

## 2016-01-15 ENCOUNTER — Telehealth: Payer: Self-pay

## 2016-01-15 DIAGNOSIS — E039 Hypothyroidism, unspecified: Secondary | ICD-10-CM

## 2016-01-15 LAB — T4 AND TSH
T4, Total: 6.8 ug/dL (ref 4.5–12.0)
TSH: 13.49 u[IU]/mL — ABNORMAL HIGH (ref 0.450–4.500)

## 2016-01-15 MED ORDER — LEVOTHYROXINE SODIUM 75 MCG PO TABS: 75.0000 ug | ORAL_TABLET | Freq: Every day | ORAL | 3 refills | Status: DC

## 2016-01-15 NOTE — Telephone Encounter (Signed)
-----   Message from Birdie Sons, MD sent at 01/15/2016  7:32 AM EDT ----- Is hypothyroid. Go ahead and start levothyroxine 75 mcg daily, #30, rf x 3 and follow up in 3 months.

## 2016-01-15 NOTE — Telephone Encounter (Signed)
Pt advised. Levothyroxine sent in. Pt will call back to schedule appointment. Renaldo Fiddler, CMA

## 2016-01-21 ENCOUNTER — Telehealth: Payer: Self-pay | Admitting: Family Medicine

## 2016-01-21 NOTE — Telephone Encounter (Signed)
Pt stated that she was advised that this was a compound medication and needed to be sent to [facility]. Pt stated that she was told the RX couldn't be transferred. Pt would like the RX to be sent to [facility] today if possible and would like a call back when it is done. Please advise. Thanks TNP

## 2016-01-21 NOTE — Telephone Encounter (Signed)
I called patient to get more information. Patient states she is allergic to some of the inactive ingredients (Lactose) in the Levothyroxine. Patient needs the prescpresription called into [facility]. She says they are able to make this medication without that ingredient. I spoke with the compounding pharmacist Louie Casa and she confirmed that they can make the Levothyroxine without the fillers that patient is allergic to. Prescription for Levothyroxine has been called into [facility].

## 2016-03-11 ENCOUNTER — Telehealth: Payer: Self-pay | Admitting: Gastroenterology

## 2016-03-11 NOTE — Telephone Encounter (Signed)
Patient needs the number to [facility] lab and needs the samples of her colonoscopy for further staining. Dr. Allen Norris did it 07/13/14. She will be waiting for you to all her.

## 2016-03-11 NOTE — Telephone Encounter (Signed)
I found the number and called the patient.

## 2016-03-19 ENCOUNTER — Encounter: Payer: Self-pay | Admitting: Gastroenterology

## 2016-03-26 ENCOUNTER — Encounter: Payer: Self-pay | Admitting: Gastroenterology

## 2016-05-19 ENCOUNTER — Other Ambulatory Visit: Payer: Self-pay

## 2016-05-19 MED ORDER — LEVOTHYROXINE SODIUM 75 MCG PO TABS: 75.0000 ug | ORAL_TABLET | Freq: Every day | ORAL | 11 refills | Status: DC

## 2016-05-19 NOTE — Telephone Encounter (Signed)
Pharmacy requesting refills. Thanks!  

## 2016-05-20 ENCOUNTER — Telehealth: Payer: Self-pay

## 2016-05-20 MED ORDER — LEVOTHYROXINE SODIUM 75 MCG PO TABS: 75.0000 ug | ORAL_TABLET | Freq: Every day | ORAL | 11 refills | Status: DC

## 2016-05-20 NOTE — Telephone Encounter (Signed)
Pt needs refill of COMPOUNDED levothyroxine as pt is allergic to an inactive ingredient. Pt states this was sent over on the 19th, and the pharmacy has not received the prescription. [facility]. Pt is almost out of medication, and needs this filled ASAP. Renaldo Fiddler, CMA

## 2016-05-20 NOTE — Telephone Encounter (Signed)
rx for synthroid was sent to pharmacy yesterday, but [facility] can't send prescriptions for compounded medications. Please call pharmacy a let them know they can refill prescription that she has filled in the past for thyroid replacement.

## 2016-05-20 NOTE — Telephone Encounter (Signed)
Rx called in to pharmacy. 

## 2016-05-20 NOTE — Telephone Encounter (Signed)
Please review. Thanks!  

## 2016-06-17 ENCOUNTER — Telehealth: Payer: Self-pay | Admitting: Family Medicine

## 2016-06-17 DIAGNOSIS — Z111 Encounter for screening for respiratory tuberculosis: Secondary | ICD-10-CM

## 2016-06-17 NOTE — Telephone Encounter (Signed)
Pt stated that she is changing jobs and needs her immunizations record and she is requesting a lab slip to have a TB Test. Please advise. Thanks TNP

## 2016-06-17 NOTE — Telephone Encounter (Signed)
OK to order TB quantitative interferon gold.

## 2016-06-17 NOTE — Telephone Encounter (Signed)
Please advise? We do not have any immunizations records for this pt.

## 2016-06-17 NOTE — Telephone Encounter (Signed)
Advised patient as below. Labs ordered.

## 2016-06-18 ENCOUNTER — Other Ambulatory Visit: Payer: Self-pay

## 2016-06-18 DIAGNOSIS — Z0184 Encounter for antibody response examination: Secondary | ICD-10-CM

## 2016-06-23 ENCOUNTER — Telehealth: Payer: Self-pay

## 2016-06-23 LAB — QUANTIFERON IN TUBE
QFT TB AG MINUS NIL VALUE: 0 IU/mL
QUANTIFERON MITOGEN VALUE: 5.68 IU/mL
QUANTIFERON TB AG VALUE: 0.02 [IU]/mL
QUANTIFERON TB GOLD: NEGATIVE
Quantiferon Nil Value: 0.02 IU/mL

## 2016-06-23 LAB — HEPATITIS B SURFACE ANTIBODY,QUALITATIVE: HEP B SURFACE AB, QUAL: NONREACTIVE

## 2016-06-23 LAB — QUANTIFERON TB GOLD ASSAY (BLOOD)

## 2016-06-23 NOTE — Telephone Encounter (Signed)
-----   Message from Birdie Sons, MD sent at 06/23/2016  4:22 PM EST ----- Patient is not immune to hepatitis B. Needs hepatitis B series. TB test is negative.

## 2016-06-23 NOTE — Telephone Encounter (Signed)
Pt advised, Pt refuses hepatitis B vaccinations. Renaldo Fiddler, CMA

## 2016-07-02 DIAGNOSIS — G43709 Chronic migraine without aura, not intractable, without status migrainosus: Secondary | ICD-10-CM | POA: Diagnosis not present

## 2016-08-05 ENCOUNTER — Ambulatory Visit (INDEPENDENT_AMBULATORY_CARE_PROVIDER_SITE_OTHER): Payer: BLUE CROSS/BLUE SHIELD | Admitting: Physician Assistant

## 2016-08-05 ENCOUNTER — Encounter: Payer: Self-pay | Admitting: Physician Assistant

## 2016-08-05 VITALS — BP 118/78 | HR 76 | Temp 98.8°F | Resp 16 | Wt 135.0 lb

## 2016-08-05 DIAGNOSIS — M542 Cervicalgia: Secondary | ICD-10-CM

## 2016-08-05 NOTE — Progress Notes (Signed)
Patient: Dawn [name] Female    DOB: [birth year]   51 y.o.   MRN: [id number] Visit Date: 08/05/2016  Today's Provider: Trinna Post, PA-C   Chief Complaint  Patient presents with  . Neck Pain    Left side.  Started Tuesday   Subjective:    Neck Pain   This is a new problem. The current episode started in the past 7 days. The problem has been gradually improving. The pain is associated with a twisting injury (stretching before exercising.). The pain is present in the left side. The quality of the pain is described as stabbing and aching. The pain is at a severity of 4/10 (Pt reports it was as high as 9/10 yesterday.). The symptoms are aggravated by twisting. Stiffness is present all day. Pertinent negatives include no chest pain, fever, headaches, leg pain, numbness, pain with swallowing, paresis, photophobia, syncope, tingling, trouble swallowing, visual change, weakness or weight loss. She has tried heat (Pt took an extra prednisone for two days. ) for the symptoms.   Patient is 51 y/o with hx of Crohns on chronic 5 mg prednisone with osteopenia presenting with above symptoms. No arm weakness. Increased her prednisone to 10 mg for two days. Has taken [facility] powder otherwise. Feeling better overall. Mainly wants to know if a C-spine collar would help her. 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 She wants to know if C-collar would help her prevent this.     Allergies  Allergen Reactions  . Codeine   . Erythromycin   . Other     ANTICHOLINERGIC MEDICATIONS   . Oxycodone Other (See Comments)  . Oxycodone-Acetaminophen Other (See Comments) and Nausea And Vomiting  . Penicillins   . Sugar-Protein-Starch   . Sulfa Drugs Cross Reactors      Current Outpatient Prescriptions:  .  albuterol (PROVENTIL HFA;VENTOLIN HFA) 108 (90 Base) MCG/ACT inhaler, Inhale 2 puffs into the lungs every 6 (six) hours as needed for wheezing or shortness of  breath., Disp: 18 g, Rfl: 3 .  CALCIUM-MAGNESIUM-VITAMIN D PO, Take 1 capsule by mouth 2 (two) times daily.  , Disp: , Rfl:  .  Cyanocobalamin (VITAMIN B-12 PO), Take 1 tablet by mouth daily., Disp: , Rfl:  .  EPINEPHrine (EPIPEN 2-PAK) 0.3 mg/0.3 mL IJ SOAJ injection, Inject 0.3 mLs (0.3 mg total) into the skin once. As needed for anaphylactic reactions, Disp: 2 Device, Rfl: 3 .  Ferrous Sulfate (IRON SUPPLEMENT PO), Take 1 tablet by mouth daily., Disp: , Rfl:  .  glucose blood test strip, , Disp: , Rfl:  .  levothyroxine (SYNTHROID, LEVOTHROID) 75 MCG tablet, Take 1 tablet (75 mcg total) by mouth daily. compound, Disp: 30 tablet, Rfl: 11 .  LORazepam (ATIVAN) 0.5 MG tablet, Take 0.5 mg by mouth daily. , Disp: , Rfl:  .  predniSONE (DELTASONE) 5 MG tablet, Take 1 tablet (5 mg total) by mouth daily., Disp: 30 tablet, Rfl: 5  Review of Systems  Constitutional: Negative.  Negative for fever and weight loss.  HENT: Negative for trouble swallowing.   Eyes: Negative for photophobia.  Cardiovascular: Negative for chest pain and syncope.  Musculoskeletal: Positive for myalgias, neck pain and neck stiffness. Negative for arthralgias, back pain, gait problem and joint swelling.  Neurological: Negative for dizziness, tingling, tremors, syncope, weakness, light-headedness, numbness and headaches.    Social History  Substance Use Topics  . Smoking status: Former Smoker  Packs/day: 0.30    Years: 10.00    Types: Cigarettes    Quit date: 01/16/1995  . Smokeless tobacco: Never Used  . Alcohol use 0.0 oz/week     Comment: occasional   Objective:   BP 118/78 (BP Location: Left Arm, Patient Position: Sitting, Cuff Size: Normal)   Pulse 76   Temp 98.8 F (37.1 C) (Oral)   Resp 16   Wt 135 lb (61.2 kg)   BMI 18.31 kg/m  Vitals:   08/05/16 1354  BP: 118/78  Pulse: 76  Resp: 16  Temp: 98.8 F (37.1 C)  TempSrc: Oral  Weight: 135 lb (61.2 kg)     Physical Exam  Constitutional: She is  oriented to person, place, and time. She appears well-developed and well-nourished.  Musculoskeletal: Normal range of motion. She exhibits no edema or deformity.  Forward head posture. No midline bony tenderness of spine. Some tightness in trapezoids, no spasms. ROM full in all planes. 5/5 strength BUE. Sensation grossly in tact distal BUE.   Neurological: She is alert and oriented to person, place, and time.  Skin: Skin is warm and dry.  Psychiatric: She has a normal mood and affect. Her behavior is normal.        Assessment & Plan:     1. Neck pain  Likely  Muscular and 2/2 posture, no red flag s/sx today. Don't think posture collar would be of much benefit for her. She'd likely benefit from a more ergonomic work environment or physical therapy/yoga for posture retraining. Patient does not want any pain medications.   Return if symptoms worsen or fail to improve.  The entirety of the information documented in the History of Present Illness, Review of Systems and Physical Exam were personally obtained by me. Portions of this information were initially documented by Ashley Royalty, CMA and reviewed by me for thoroughness and accuracy.          Trinna Post, PA-C  [facility]

## 2016-08-18 ENCOUNTER — Encounter: Payer: Self-pay | Admitting: Family Medicine

## 2016-08-18 ENCOUNTER — Ambulatory Visit (INDEPENDENT_AMBULATORY_CARE_PROVIDER_SITE_OTHER): Payer: BLUE CROSS/BLUE SHIELD | Admitting: Family Medicine

## 2016-08-18 VITALS — BP 100/70 | HR 100 | Temp 98.0°F | Resp 16 | Wt 135.0 lb

## 2016-08-18 DIAGNOSIS — Z7952 Long term (current) use of systemic steroids: Secondary | ICD-10-CM | POA: Diagnosis not present

## 2016-08-18 DIAGNOSIS — Z91038 Other insect allergy status: Secondary | ICD-10-CM | POA: Diagnosis not present

## 2016-08-18 DIAGNOSIS — F418 Other specified anxiety disorders: Secondary | ICD-10-CM | POA: Diagnosis not present

## 2016-08-18 DIAGNOSIS — M8580 Other specified disorders of bone density and structure, unspecified site: Secondary | ICD-10-CM | POA: Diagnosis not present

## 2016-08-18 DIAGNOSIS — J45909 Unspecified asthma, uncomplicated: Secondary | ICD-10-CM | POA: Diagnosis not present

## 2016-08-18 DIAGNOSIS — K50814 Crohn's disease of both small and large intestine with abscess: Secondary | ICD-10-CM | POA: Diagnosis not present

## 2016-08-18 MED ORDER — EPINEPHRINE 0.3 MG/0.3ML IJ SOAJ: 0.3000 mg | Freq: Once | INTRAMUSCULAR | 3 refills | Status: AC

## 2016-08-18 MED ORDER — PREDNISONE 5 MG PO TABS: 5.0000 mg | ORAL_TABLET | Freq: Every day | ORAL | 5 refills | Status: DC

## 2016-08-18 MED ORDER — LORAZEPAM 0.5 MG PO TABS: ORAL_TABLET | ORAL | 3 refills | Status: DC

## 2016-08-18 NOTE — Progress Notes (Signed)
Patient: Dawn [name] Female    DOB: [birth year]   51 y.o.   MRN: [id number] Visit Date: 08/18/2016  Today's Provider: Lelon Huh, MD   Chief Complaint  Patient presents with  . Hypothyroidism    follow up  . Anxiety    follow up   Subjective:    HPI Follow up of Hypothyroidism:  Patient was last seen for this problem 7 months ago and was started on Levothyroxine 19mcg daily. Patient reports good compliance with treatment, god tolerance and good symptom control.  Lab Results  Component Value Date   TSH 13.490 (H) 01/14/2016     Follow up of Anxiety:  Patient was previously followed by Psychiatry. Patient brings in a letter today that states her psychiatrist is no longer practicing clinical psychiatry and she would like her medications to be followed by her PCP. She has been taking 0.5mg  lorazepam for years. She usually takes it in the evening to help relax and rest, but occasionally requires medication during the day when she has long term stressful situation, such as health or family problems.     Allergies  Allergen Reactions  . Codeine   . Erythromycin   . Other     ANTICHOLINERGIC MEDICATIONS   . Oxycodone Other (See Comments)  . Oxycodone-Acetaminophen Other (See Comments) and Nausea And Vomiting  . Penicillins   . Sugar-Protein-Starch   . Sulfa Drugs Cross Reactors      Current Outpatient Prescriptions:  .  albuterol (PROVENTIL HFA;VENTOLIN HFA) 108 (90 Base) MCG/ACT inhaler, Inhale 2 puffs into the lungs every 6 (six) hours as needed for wheezing or shortness of breath., Disp: 18 g, Rfl: 3 .  CALCIUM-MAGNESIUM-VITAMIN D PO, Take 1 capsule by mouth 2 (two) times daily.  , Disp: , Rfl:  .  Cyanocobalamin (VITAMIN B-12 PO), Take 1 tablet by mouth daily., Disp: , Rfl:  .  EPINEPHrine (EPIPEN 2-PAK) 0.3 mg/0.3 mL IJ SOAJ injection, Inject 0.3 mLs (0.3 mg total) into the skin once. As needed for anaphylactic reactions, Disp: 2 Device, Rfl: 3 .  Ferrous  Sulfate (IRON SUPPLEMENT PO), Take 1 tablet by mouth daily., Disp: , Rfl:  .  glucose blood test strip, , Disp: , Rfl:  .  levothyroxine (SYNTHROID, LEVOTHROID) 75 MCG tablet, Take 1 tablet (75 mcg total) by mouth daily. compound, Disp: 30 tablet, Rfl: 11 .  LORazepam (ATIVAN) 0.5 MG tablet, Take 0.5 mg by mouth daily. , Disp: , Rfl:  .  predniSONE (DELTASONE) 5 MG tablet, Take 1 tablet (5 mg total) by mouth daily., Disp: 30 tablet, Rfl: 5  Review of Systems  Constitutional: Negative for appetite change, chills, fatigue and fever.  Respiratory: Negative for chest tightness and shortness of breath.   Cardiovascular: Negative for chest pain and palpitations.  Gastrointestinal: Negative for abdominal pain, nausea and vomiting.  Neurological: Negative for dizziness and weakness.    Social History  Substance Use Topics  . Smoking status: Former Smoker    Packs/day: 0.30    Years: 10.00    Types: Cigarettes    Quit date: 01/16/1995  . Smokeless tobacco: Never Used  . Alcohol use 0.0 oz/week     Comment: occasional   Objective:   BP 100/70 (BP Location: Left Arm, Patient Position: Sitting, Cuff Size: Normal)   Pulse 100   Temp 98 F (36.7 C) (Oral)   Resp 16   Wt 135 lb (61.2 kg)   SpO2 98% Comment: room air  BMI 18.31 kg/m  There were no vitals filed for this visit.   Physical Exam   General Appearance:    Alert, cooperative, no distress  Eyes:    PERRL, conjunctiva/corneas clear, EOM's intact       Lungs:     Clear to auscultation bilaterally, respirations unlabored  Heart:    Regular rate and rhythm  Neurologic:   Awake, alert, oriented x 3. No apparent focal neurological           defect.           Assessment & Plan:     1. Situational anxiety Doing well on lorazepam once in the evening most days. Will refill here since her psychiatrist.is no longer practicing.  - LORazepam (ATIVAN) 0.5 MG tablet; Take 1 tablet once or twice a day as needed for anxiety  Dispense: 60  tablet; Refill: 3  2. Chronic asthma without complication, unspecified asthma severity, unspecified whether persistent Well controlled.  On chronic steroid.  - DG Bone Density; Future  3. Crohn's disease of both small and large intestine with abscess ([place]) On chronic prednisone  - DG Bone Density; Future  4. History of insect sting allergy Need refill  - EPINEPHrine (EPIPEN 2-PAK) 0.3 mg/0.3 mL IJ SOAJ injection; Inject 0.3 mLs (0.3 mg total) into the skin once. As needed for anaphylactic reactions  Dispense: 2 Device; Refill: 3  5. Osteopenia, unspecified location  - DG Bone Density; Future  6. Current chronic use of systemic steroids  - DG Bone Density; Future     The entirety of the information documented in the History of Present Illness, Review of Systems and Physical Exam were personally obtained by me. Portions of this information were initially documented by Meyer Cory, CMA and reviewed by me for thoroughness and accuracy.    Lelon Huh, MD  [facility]

## 2016-09-22 IMAGING — CT CT CHEST W/O CM
1 series · 15 of 33 positions shown, 19 images · non-contrast
Comparison: None.

CLINICAL DATA: Myasthenia gravis

EXAM:
CT CHEST WITHOUT CONTRAST
TECHNIQUE: Multidetector CT imaging of the chest was performed following the
standard protocol without IV contrast.

[Series 2: thorax · axial · 0.67mm/px · z∈[-650,-385]mm · 15 of 63 slices shown, 19 images]
[im 5/63  mediastinal]
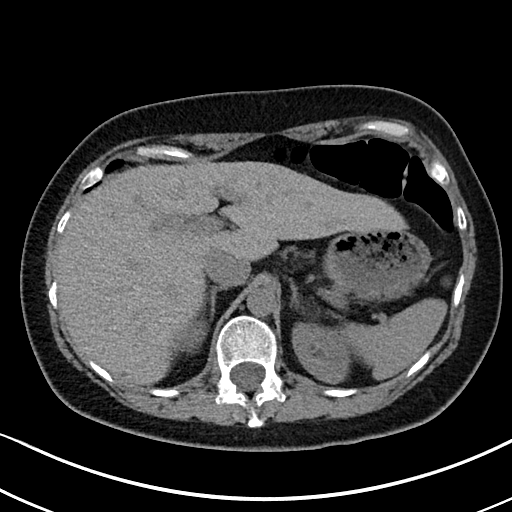
[im 5/63  lung]
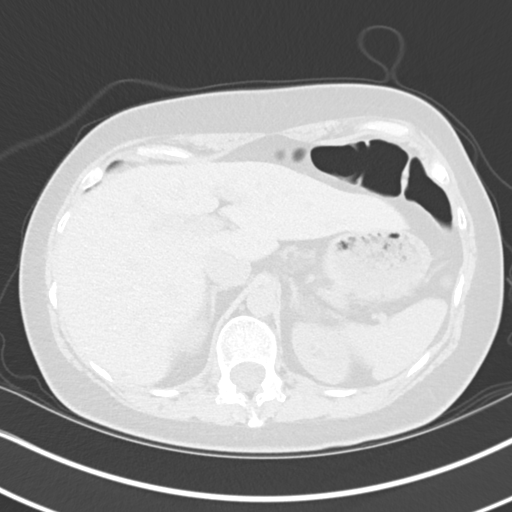
[im 10/63  lung]
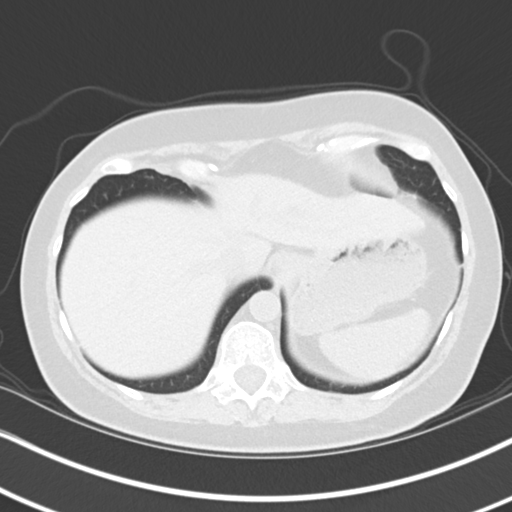
[im 13/63  lung]
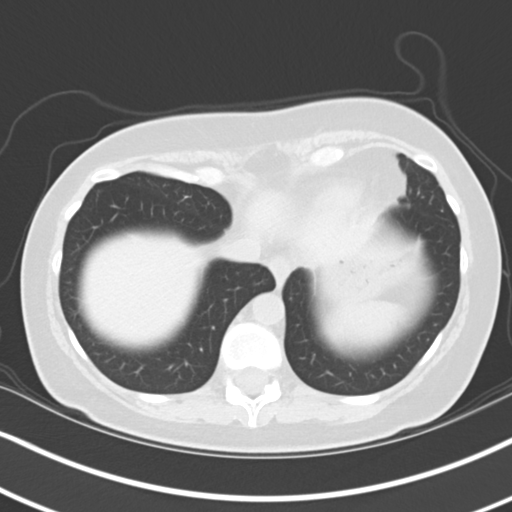
[im 17/63  lung]
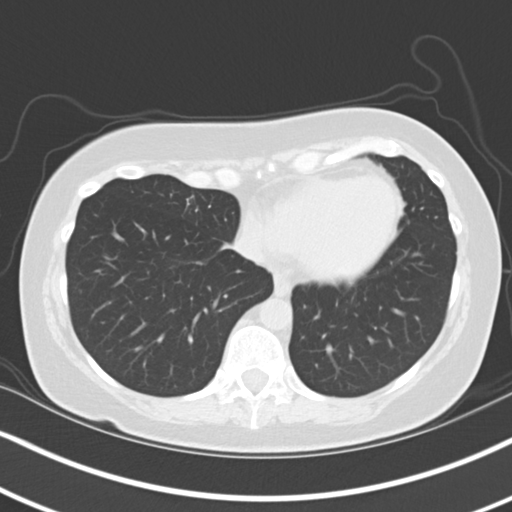
[im 21/63  mediastinal]
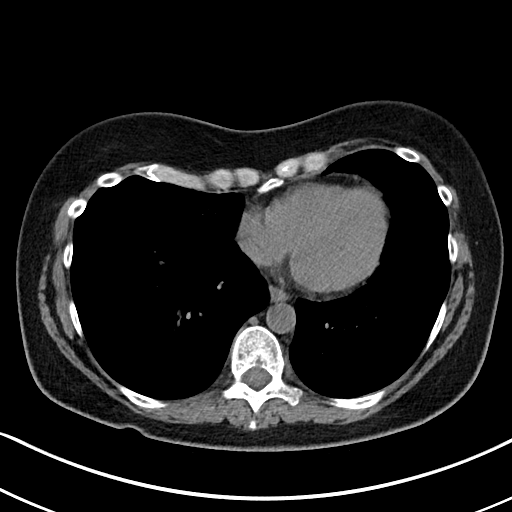
[im 21/63  lung]
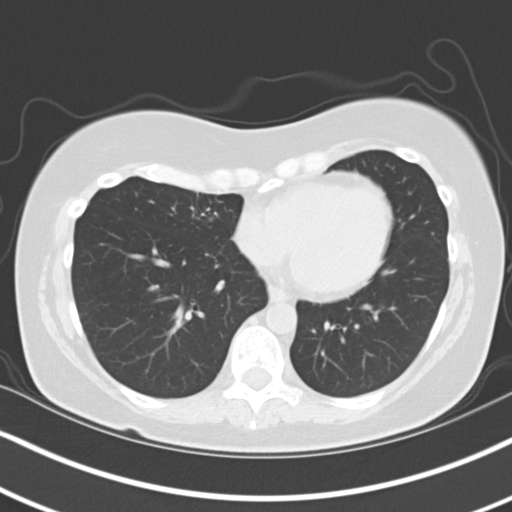
[im 25/63  lung]
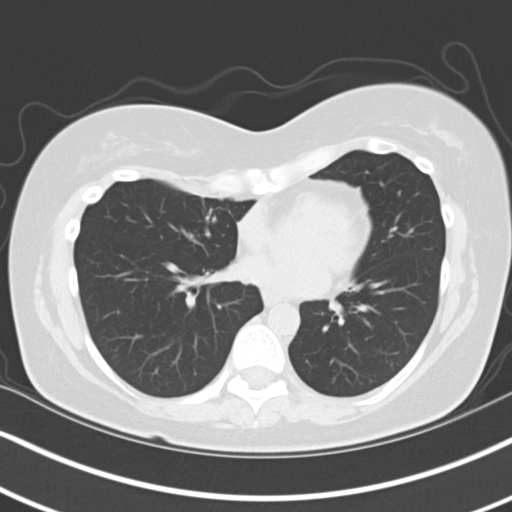
[im 28/63  lung]
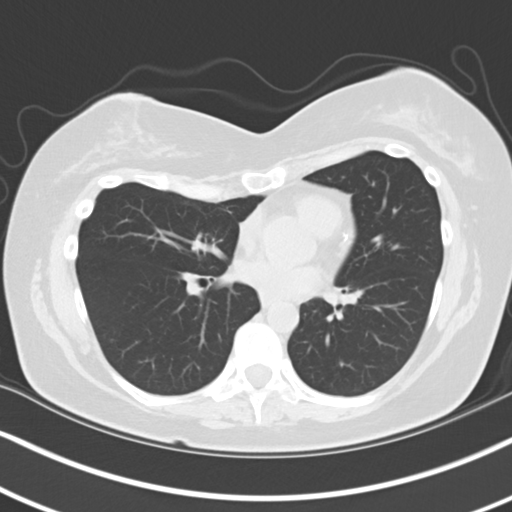
[im 33/63  lung]
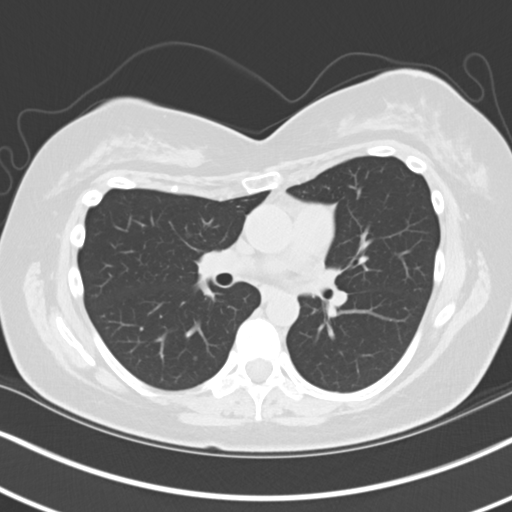
[im 35/63  mediastinal]
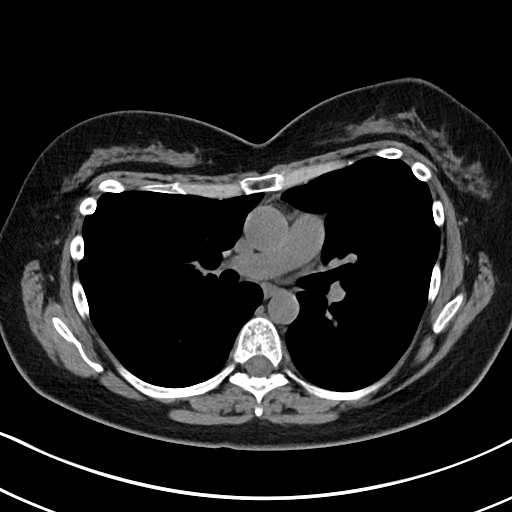
[im 35/63  lung]
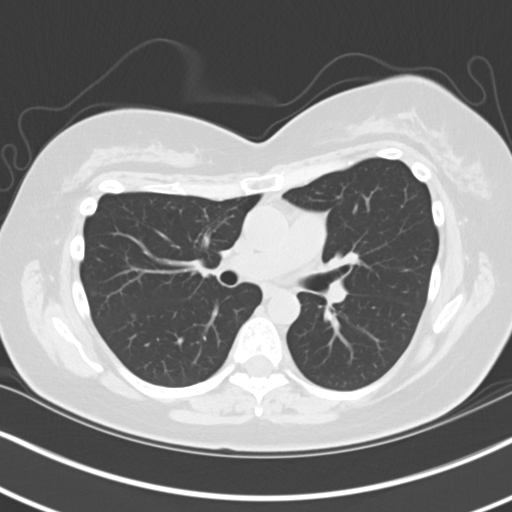
[im 38/63  lung]
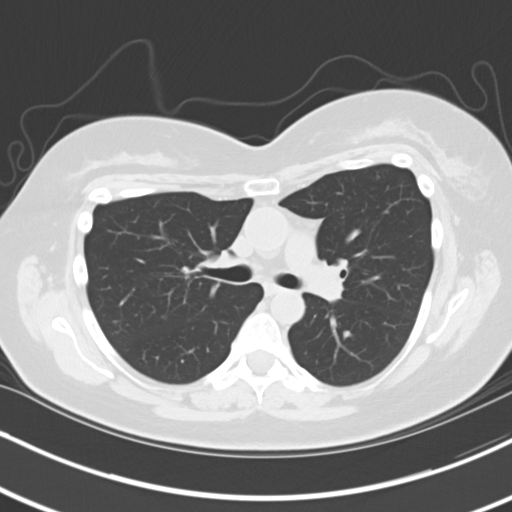
[im 42/63  lung]
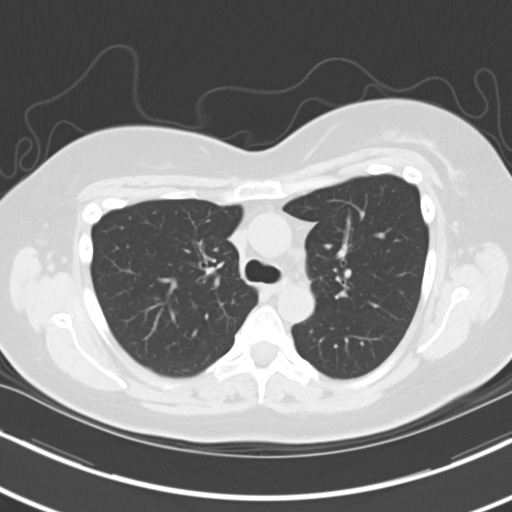
[im 46/63  lung]
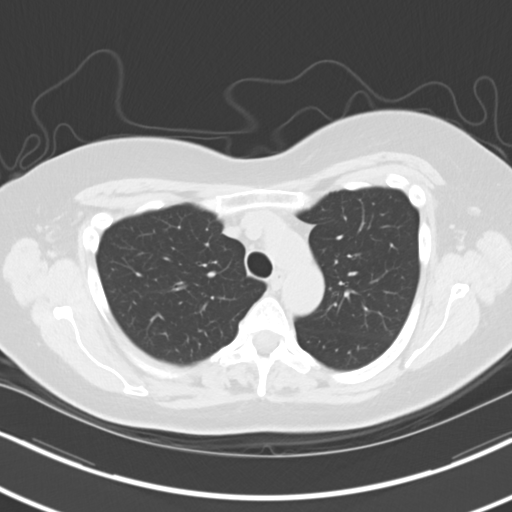
[im 50/63  mediastinal]
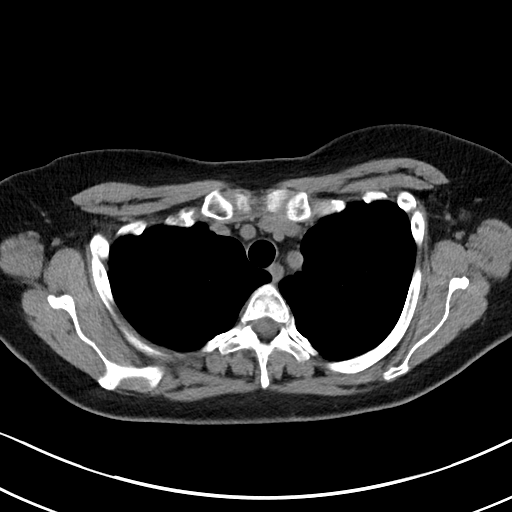
[im 50/63  lung]
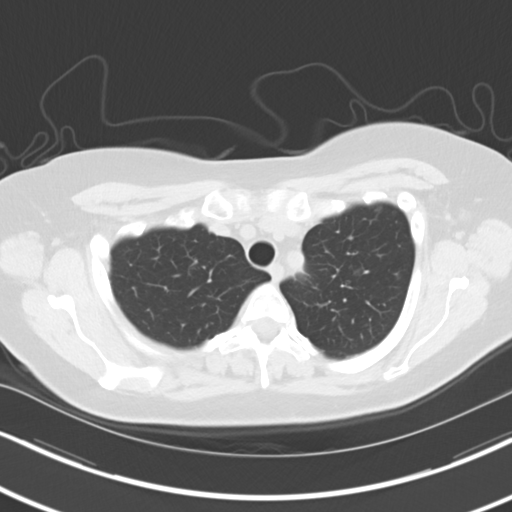
[im 53/63  lung]
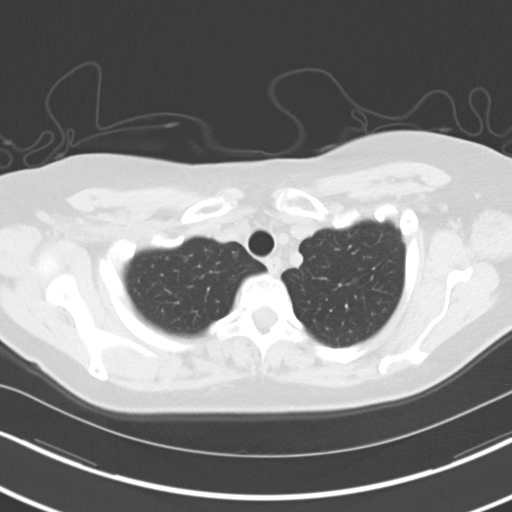
[im 58/63  lung]
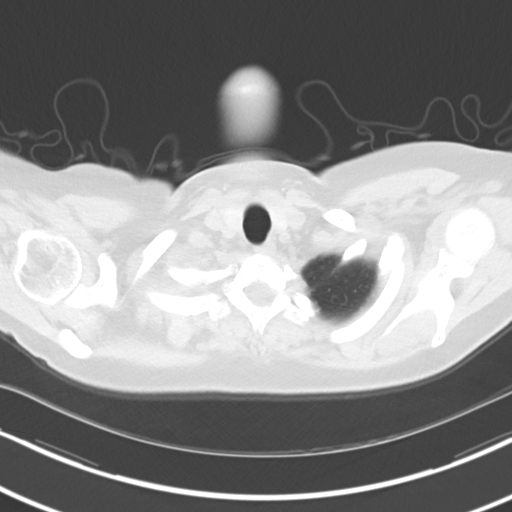

[15 of 33 positions shown; findings below may reference images not displayed]

FINDINGS: There is no evidence of anterior mediastinal mass to suggest
thymoma. No abnormal mediastinal adenopathy. No pericardial
effusion.

Minimal LAD coronary artery calcification.

There are bilateral patchy and ill-defined pulmonary opacities in
both upper lobes, more prominent on the right. The largest
abnormality is on image 24 in the right upper lobe measuring 9 mm.

Pectus excavatum deformity. No evidence of acute rib fracture.
Superior endplate Schmorl's nodes are noted at T1 and T3. No
compression deformity. There is anterior buckling of the lower
sternal cortex which has a chronic appearance.

Thyroid is somewhat heterogeneous without obvious mass.
IMPRESSION: No evidence of thymoma.

Bilateral upper lobe ground-glass opacities as described. The
largest measures 9 mm. This may simply represent scarring or an
inflammatory process. Adenocarcinoma can have a similar appearance.
Adenocarcinoma cannot be excluded. Follow up by CT is recommended in
12 months, with continued annual surveillance for a minimum of 3
years.These recommendations are taken from:Recommendations for the
Management of Subsolid Pulmonary Nodules Detected at CT: A Statement

## 2016-10-07 ENCOUNTER — Other Ambulatory Visit: Payer: BLUE CROSS/BLUE SHIELD

## 2017-01-08 ENCOUNTER — Other Ambulatory Visit: Payer: Self-pay | Admitting: Family Medicine

## 2017-01-08 DIAGNOSIS — J45909 Unspecified asthma, uncomplicated: Secondary | ICD-10-CM

## 2017-01-08 DIAGNOSIS — K50814 Crohn's disease of both small and large intestine with abscess: Secondary | ICD-10-CM

## 2017-01-19 ENCOUNTER — Other Ambulatory Visit: Payer: Self-pay | Admitting: Family Medicine

## 2017-02-09 DIAGNOSIS — N390 Urinary tract infection, site not specified: Secondary | ICD-10-CM | POA: Diagnosis not present

## 2017-02-09 DIAGNOSIS — R350 Frequency of micturition: Secondary | ICD-10-CM | POA: Diagnosis not present

## 2017-02-16 ENCOUNTER — Other Ambulatory Visit: Payer: Self-pay | Admitting: Family Medicine

## 2017-02-16 DIAGNOSIS — F418 Other specified anxiety disorders: Secondary | ICD-10-CM

## 2017-02-17 NOTE — Telephone Encounter (Signed)
Pharmacy requesting refills. Thanks!  

## 2017-02-17 NOTE — Telephone Encounter (Signed)
Rx called in to pharmacy. 

## 2017-02-17 NOTE — Telephone Encounter (Signed)
Please call in lorazepam.  

## 2017-02-24 ENCOUNTER — Ambulatory Visit (INDEPENDENT_AMBULATORY_CARE_PROVIDER_SITE_OTHER): Payer: BLUE CROSS/BLUE SHIELD | Admitting: Physician Assistant

## 2017-02-24 ENCOUNTER — Ambulatory Visit (INDEPENDENT_AMBULATORY_CARE_PROVIDER_SITE_OTHER): Payer: BLUE CROSS/BLUE SHIELD

## 2017-02-24 DIAGNOSIS — M79641 Pain in right hand: Secondary | ICD-10-CM | POA: Diagnosis not present

## 2017-02-24 NOTE — Progress Notes (Signed)
Office Visit Note   Patient: Dawn [name]           Date of Birth: [birth year]           MRN: [id number] Visit Date: 02/24/2017              Requested by: Dawn Sons, MD [address] [place], [place] [address] PCP: Dawn Sons, MD   Assessment & Plan: Visit Diagnoses:  1. Pain in right hand     Plan: We will place her in a removable Velcro wrist splint on the right hand.  She will come out of the splint periodically throughout the distal work on gentle range of motion of the hand.  No heavy lifting.  We will see her back in 2 weeks for reexamination no radiographs unless clinically indicated.  Follow-Up Instructions: Return in about 2 weeks (around 03/10/2017).   Orders:  Orders Placed This Encounter  Procedures  . XR Hand Complete Right   No orders of the defined types were placed in this encounter.     Procedures: No procedures performed   Clinical Data: No additional findings.   Subjective: Right hand pain  HPI Dawn [name] is a 51 year old female who is well-known to Dawn [name] service comes in today with right hand pain.  She reports on 02/22/2017 she was walking 2 of her dogs and pet Smart initially while walking the 90 pound of which she was pulling quite a bit but then the 120 pounds she was walking while her husband walked in a 90 pound dog and he began pulling she developed pain in the hand after leaving pet Smart.  Then eventually developed some bruising on the back of her hand.  She is on chronic prednisone has osteoporosis and  she is also on vitamin D. Review of Systems Positive for right hand pain.  Positive numbness tingling right hand.  Review of systems otherwise negative.  Objective: Vital Signs: There were no vitals taken for this visit.  Physical Exam  Constitutional: She is oriented to person, place, and time. She appears well-developed and well-nourished.  Neurological: She is alert and oriented to person, place, and  time.  Skin: She is not diaphoretic.  Psychiatric: She has a normal mood and affect. Her behavior is normal.    Ortho Exam Bilateral hands 2 to bring fingers to full extension and able to make a fist.  There is no malrotation at the metacarpal phalangeal joints of either hand.  Right hand she has bruising over the dorsal mid hand at the level of the cyst third and fourth metacarpal phalangeal joints.  She has full motor of both hands.  Subjective decreased sensation over the ulnar distribution of the right hand.  Maximal tenderness is over the base of the third metacarpal base.  Radial pulses intact bilaterally.  Nontender over the distal radius and ulna able to fully supinate and pronate the right forearm. Specialty Comments:  No specialty comments available.  Imaging: Xr Hand Complete Right  Result Date: 02/24/2017 Right hand 3 views: No acute fracture.  No evidence of subluxation or dislocation.  Carpal bones are all well aligned.  Wrist joint is without any acute findings    PMFS History: Patient Active Problem List   Diagnosis Date Noted  . Current chronic use of systemic steroids 08/18/2016  . DOE (dyspnea on exertion) 11/02/2015  . Bilateral headaches 11/01/2015  . Weakness 11/01/2015  . Arthralgia 12/11/2014  . Headache 12/11/2014  .  History of anaphylaxis 12/11/2014  . Acquired hypothyroidism 12/11/2014  . Raynaud's phenomenon 12/11/2014  . Rosacea 12/11/2014  . Fatigue 12/11/2014  . Asthma, chronic 02/26/2014  . History of insect sting allergy 02/26/2014  . Allergy to pollen 02/23/2006  . Osteopenia 02/23/2006  . Asthma, exogenous 02/23/2006  . Crohn's disease of both small and large intestine with abscess ([place]) 04/28/2005  . Nicotine dependence 04/29/2003  . Situational anxiety 04/28/1998   Past Medical History:  Diagnosis Date  . Acquired hypothyroidism 12/11/2014  . Allergy to pollen 02/23/2006  . Anxiety   . Asthma   . Asthma, chronic 02/26/2014   Onset as  teenager   . Asthma, exogenous 02/23/2006  . Crohn's disease of both small and large intestine with abscess ([place]) 04/28/2005   (Ulcerative Colitis)   . Fatigue 12/11/2014  . Headache 12/11/2014  . History of anaphylaxis 12/11/2014  . History of insect sting allergy 02/26/2014   Hymenoptera, carries EpiPen   . Hx of colonic polyps   . Hx: UTI (urinary tract infection)   . Osteoporosis due to androgen therapy 02/23/2006  . Raynaud's phenomenon 12/11/2014  . Rosacea 12/11/2014  . Tobacco abuse 04/29/2003  . Ulcerative colitis     Family History  Problem Relation Age of Onset  . Cancer Mother        gallbladder  . Irritable bowel syndrome Mother   . Prostate cancer Father   . Heart disease Father   . Colon cancer Paternal Grandmother   . Renal cancer Maternal Grandmother     Past Surgical History:  Procedure Laterality Date  . ELECTROMYOGRAPHY  06/13/15   UE EMG Normal  . SHOULDER SURGERY     Social History   Occupational History  . Inpatient Coder    Social History Main Topics  . Smoking status: Former Smoker    Packs/day: 0.30    Years: 10.00    Types: Cigarettes    Quit date: 01/16/1995  . Smokeless tobacco: Never Used  . Alcohol use 0.0 oz/week     Comment: occasional  . Drug use: No  . Sexual activity: Not on file

## 2017-03-16 ENCOUNTER — Ambulatory Visit (INDEPENDENT_AMBULATORY_CARE_PROVIDER_SITE_OTHER): Payer: BLUE CROSS/BLUE SHIELD | Admitting: Physician Assistant

## 2017-03-25 DIAGNOSIS — J029 Acute pharyngitis, unspecified: Secondary | ICD-10-CM | POA: Diagnosis not present

## 2017-03-28 DIAGNOSIS — J029 Acute pharyngitis, unspecified: Secondary | ICD-10-CM | POA: Diagnosis not present

## 2017-03-28 DIAGNOSIS — K122 Cellulitis and abscess of mouth: Secondary | ICD-10-CM | POA: Diagnosis not present

## 2017-05-28 ENCOUNTER — Other Ambulatory Visit: Payer: Self-pay | Admitting: Family Medicine

## 2017-05-28 NOTE — Telephone Encounter (Signed)
Gregory in [employer] called refill request for the following medications:  levothyroxine (SYNTHROID, LEVOTHROID) 75 MCG tablet    Last Rx: 05/20/16 with 11 refills LOV: 08/18/16  Jim with [facility] stated pt was with [facility] and since they closed pt now using [facility]. Clair Gulling stated that [facility] transferred all pt's [facility] and [facility] to get [facility]. [facility] advised that the Rx had expired.   I tried to contact pt to verify that she wanted to use [facility] b/c it wasn't on her preferred pharmacy list. No answer LMTCB.  Please advise. Thanks TNP

## 2017-05-28 NOTE — Telephone Encounter (Signed)
Pt returned call and confirmed that she does want to use [facility] for her levothyroxine (SYNTHROID, LEVOTHROID) 75 MCG tablet medication b/c they do compound medications at that location. Thanks TNP

## 2017-05-29 MED ORDER — LEVOTHYROXINE SODIUM 75 MCG PO TABS: 75.0000 ug | ORAL_TABLET | Freq: Every day | ORAL | 6 refills | Status: DC

## 2017-07-28 ENCOUNTER — Other Ambulatory Visit: Payer: Self-pay | Admitting: Family Medicine

## 2017-07-28 DIAGNOSIS — K50814 Crohn's disease of both small and large intestine with abscess: Secondary | ICD-10-CM

## 2017-07-28 DIAGNOSIS — J45909 Unspecified asthma, uncomplicated: Secondary | ICD-10-CM

## 2017-08-17 ENCOUNTER — Encounter: Payer: Self-pay | Admitting: Pulmonary Disease

## 2017-08-17 ENCOUNTER — Ambulatory Visit: Payer: BLUE CROSS/BLUE SHIELD | Admitting: Pulmonary Disease

## 2017-08-17 ENCOUNTER — Other Ambulatory Visit (INDEPENDENT_AMBULATORY_CARE_PROVIDER_SITE_OTHER): Payer: BLUE CROSS/BLUE SHIELD

## 2017-08-17 DIAGNOSIS — R0602 Shortness of breath: Secondary | ICD-10-CM

## 2017-08-17 LAB — COMPREHENSIVE METABOLIC PANEL
ALK PHOS: 46 U/L (ref 39–117)
ALT: 11 U/L (ref 0–35)
AST: 16 U/L (ref 0–37)
Albumin: 4.1 g/dL (ref 3.5–5.2)
BILIRUBIN TOTAL: 0.9 mg/dL (ref 0.2–1.2)
BUN: 11 mg/dL (ref 6–23)
CALCIUM: 9.6 mg/dL (ref 8.4–10.5)
CO2: 29 meq/L (ref 19–32)
Chloride: 101 mEq/L (ref 96–112)
Creatinine, Ser: 0.76 mg/dL (ref 0.40–1.20)
GFR: 84.34 mL/min (ref >60.00)
Glucose, Bld: 77 mg/dL (ref 70–99)
Potassium: 3.7 mEq/L (ref 3.5–5.1)
Sodium: 138 mEq/L (ref 135–145)
Total Protein: 6.6 g/dL (ref 6.0–8.3)

## 2017-08-17 LAB — CBC WITH DIFFERENTIAL/PLATELET
BASOS ABS: 0.1 10*3/uL (ref 0.0–0.1)
Basophils Relative: 0.8 % (ref 0.0–3.0)
Eosinophils Absolute: 0.2 10*3/uL (ref 0.0–0.7)
Eosinophils Relative: 2.4 % (ref 0.0–5.0)
HEMATOCRIT: 44.7 % (ref 36.0–46.0)
Hemoglobin: 15 g/dL (ref 12.0–15.0)
LYMPHS PCT: 34.2 % (ref 12.0–46.0)
Lymphs Abs: 2.7 10*3/uL (ref 0.7–4.0)
MCHC: 33.7 g/dL (ref 30.0–36.0)
MCV: 91.4 fl (ref 78.0–100.0)
MONOS PCT: 7.8 % (ref 3.0–12.0)
Monocytes Absolute: 0.6 10*3/uL (ref 0.1–1.0)
NEUTROS ABS: 4.3 10*3/uL (ref 1.4–7.7)
Neutrophils Relative %: 54.8 % (ref 43.0–77.0)
Platelets: 279 10*3/uL (ref 150.0–400.0)
RBC: 4.89 Mil/uL (ref 3.87–5.11)
RDW: 13.2 % (ref 11.5–15.5)
WBC: 7.9 10*3/uL (ref 4.0–10.5)

## 2017-08-17 MED ORDER — BUDESONIDE-FORMOTEROL FUMARATE 160-4.5 MCG/ACT IN AERO: 2.0000 | INHALATION_SPRAY | Freq: Two times a day (BID) | RESPIRATORY_TRACT | 6 refills | Status: AC

## 2017-08-17 NOTE — Patient Instructions (Signed)
Symbicort two puffs twice per day, and rinse mouth after each use  Lab tests today  Will schedule pulmonary function test and CT chest  Follow up in 2 weeks with Dr. Halford Chessman or Nurse Practitioner

## 2017-08-17 NOTE — Progress Notes (Signed)
[facility]  Chief Complaint  Patient presents with  . Acute Visit    Increased asthma symptoms, shortness of breath    Vital signs: BP 118/80 (BP Location: Right Arm, Cuff Size: Normal)   Pulse 72   Temp (!) 96.8 F (36 C) (Oral)   Ht 5' 5.5" (1.664 m)   Wt 133 lb 3.2 oz (60.4 kg)   SpO2 98%   BMI 21.83 kg/m   History of Present Illness: Dawn [name] is a 52 y.o. female former smoker with asthma.    She was previously seen by Dr. Annamaria Boots and last seen in 2016.  More recently has been seen by Dr. Raul Del in [place].  She was on MTX for psoriatic arthritis.  She also has hx of Sjogren's and Raynauds.  She was not able to tolerate this, but not clear why.  She was seen by allergist before, but didn't respond to therapies.  She was diagnosed with a mast cell disorder by neurologist in [place], and told there isn't any effective therapy.  She has been getting more short of breath.  Worse with increase in pollen.  Gets cough and wheeze.  Not bringing up much sputum.  Denies eczema or hives.  Has some sinus congestion and post nasal drip.  Wants to know if she is candidate for xolair.  Hasn't had recent CT chest or PFT.  She has problem with allergies around cats, pollen, grasses.  She has hx of IBD.  She is on chronic prednisone at 5 mg.  She is followed by GI.   Physical Exam:  General - pleasant Eyes - pupils reactive ENT - no sinus tenderness, no oral exudate, no LAN Cardiac - regular, no murmur Chest - no wheeze, rales Abd - soft, non tender Ext - no edema Skin - no rashes Neuro - normal strength Psych - normal mood  Discussion: She has hx of allergic asthma and rhinitis with progression in her symptoms.  She had CT chest in 2017 that showed upper lobe opacities and pectus excavatum.  She has hx of psoriatic arthritis, Sjogren's, and inflammatory bowel disease.  She is on chronic prednisone for IBD.  She was intolerant of MTX  previously.  She reports hx of mast cell disorder, but not clear what was involved with this.  -She would like assessment for xolair if she is suitable candidate  Assessment/Plan:  Dyspnea on exertion. - add symbicort - continue prn albuterol - repeat CT chest with contrast - check CBC with diff, CMET, RAST with IgE - arrange for PFT   Patient Instructions  Symbicort two puffs twice per day, and rinse mouth after each use  Lab tests today  Will schedule pulmonary function test and CT chest  Follow up in 2 weeks with Dr. Halford Chessman or Nurse Practitioner    Chesley Mires, MD [facility] Pulmonary/Critical Care 08/17/2017, 9:54 AM  Flow Sheet  Pulmonary tests: RAST 02/23/14 >> positive CT chest 07/11/15 >> minimal CAD, upper lobe patchy opacities Rt > Lt, pectus excavatum Quantiferon gold 06/18/16 >> negative  Past Medical History: She  has a past medical history of Acquired hypothyroidism (12/11/2014), Allergy to pollen (02/23/2006), Anxiety, Asthma, Asthma, chronic (02/26/2014), Asthma, exogenous (02/23/2006), Crohn's disease of both small and large intestine with abscess ([place]) (04/28/2005), Fatigue (12/11/2014), Headache (12/11/2014), History of anaphylaxis (12/11/2014), History of insect sting allergy (02/26/2014), colonic polyps, UTI (urinary tract infection), Osteoporosis due to androgen therapy (02/23/2006), Raynaud's phenomenon (12/11/2014), Rosacea (12/11/2014), Tobacco abuse (04/29/2003),  and Ulcerative colitis.  Past Surgical History: She  has a past surgical history that includes Shoulder surgery and Electromyography (06/13/15).  Family History: Her family history includes Cancer in her mother; Colon cancer in her paternal grandmother; Heart disease in her father; Irritable bowel syndrome in her mother; Prostate cancer in her father; Renal cancer in her maternal grandmother.  Social History: She  reports that she quit smoking about 22 years ago. Her smoking use included cigarettes. She has a  3.00 pack-year smoking history. She has never used smokeless tobacco. She reports that she drinks alcohol. She reports that she does not use drugs.   Review of systems: 12 point ROS negative except in HPI  Medications: Allergies as of 08/17/2017      Reactions   Codeine    Erythromycin    Other    ANTICHOLINERGIC MEDICATIONS    Oxycodone Other (See Comments)   Oxycodone-acetaminophen Other (See Comments), Nausea And Vomiting   Penicillins    Sugar-protein-starch    Sulfa Drugs Cross Reactors       Medication List        Accurate as of 08/17/17  9:54 AM. Always use your most recent med list.          budesonide-formoterol 160-4.5 MCG/ACT inhaler Commonly known as:  SYMBICORT Inhale 2 puffs into the lungs 2 (two) times daily.   CALCIUM-MAGNESIUM-VITAMIN D PO Take 1 capsule by mouth 2 (two) times daily.   [employer] CA CITRATE+D/MAGNESIUM Tabs Take by mouth.   EPINEPHrine 0.3 mg/0.3 mL Soaj injection Commonly known as:  EPI-PEN   glucose blood test strip   IRON SUPPLEMENT PO Take 1 tablet by mouth daily.   levothyroxine 75 MCG tablet Commonly known as:  SYNTHROID, LEVOTHROID Take 1 tablet (75 mcg total) by mouth daily. compound   LORazepam 0.5 MG tablet Commonly known as:  ATIVAN TAKE 1 TABLET BY MOUTH ONCE OR TWICE DAILY AS NEEDED FOR ANXIETY   predniSONE 5 MG tablet Commonly known as:  DELTASONE TAKE 1 TABLET(5 MG) BY MOUTH DAILY   PROAIR HFA 108 (90 Base) MCG/ACT inhaler Generic drug:  albuterol INHALE 2 PUFFS INTO THE LUNGS EVERY 6 HOURS AS NEEDED FOR WHEEZING OR SHORTNESS OF BREATH   VITAMIN B-12 PO Take 1 tablet by mouth daily.

## 2017-08-20 ENCOUNTER — Ambulatory Visit: Payer: BLUE CROSS/BLUE SHIELD

## 2017-08-24 ENCOUNTER — Ambulatory Visit: Payer: BLUE CROSS/BLUE SHIELD

## 2017-08-24 LAB — ALLERGEN PROFILE, PERENNIAL ALLERGEN IGE
Aureobasidi Pullulans IgE: <0.1 kU/L
Cladosporium Herbarum IgE: <0.1 kU/L
Cow Dander IgE: <0.1 kU/L
D Farinae IgE: 2.35 kU/L — AB
D001-IGE D PTERONYSSINUS: 1.16 kU/L — AB
Duck Feathers IgE: <0.1 kU/L
Goose Feathers IgE: <0.1 kU/L
M006-IGE ALTERNARIA ALTERNATA: 0.21 kU/L — AB
Mouse Urine IgE: <0.1 kU/L
Penicillium Chrysogen IgE: <0.1 kU/L
Phoma Betae IgE: <0.1 kU/L
Setomelanomma Rostrat: 0.11 kU/L — AB
Stemphylium Herbarum IgE: <0.1 kU/L

## 2017-09-01 ENCOUNTER — Telehealth: Payer: Self-pay | Admitting: Pulmonary Disease

## 2017-09-01 ENCOUNTER — Ambulatory Visit: Admission: RE | Admit: 2017-09-01 | Payer: BLUE CROSS/BLUE SHIELD | Source: Ambulatory Visit

## 2017-09-01 DIAGNOSIS — R0602 Shortness of breath: Secondary | ICD-10-CM

## 2017-09-01 NOTE — Telephone Encounter (Signed)
Called and spoke with patient, she states that she had a brain CT done around a year ago and had a reaction to the contrast. She had dizziness and flushing. She is requesting that we perform this CT without contrast.   VS please advise on this, thanks.

## 2017-09-01 NOTE — Telephone Encounter (Signed)
Okay to change order for CT chest w/o contrast.

## 2017-09-01 NOTE — Telephone Encounter (Signed)
Called patient, unable to reach left message to give us a call back. 

## 2017-09-01 NOTE — Telephone Encounter (Signed)
Ok to change scan per VS.   Spoke with patient advised her it has been changed.

## 2017-09-01 NOTE — Telephone Encounter (Signed)
Patient returned call, CB is [phone].  Requesting if cannot reach her to please leave a message whether ok to have CT without contrast if able to.

## 2017-09-03 ENCOUNTER — Ambulatory Visit: Payer: BLUE CROSS/BLUE SHIELD | Admitting: Adult Health

## 2017-09-08 ENCOUNTER — Other Ambulatory Visit: Payer: Self-pay | Admitting: Family Medicine

## 2017-09-08 NOTE — Telephone Encounter (Signed)
Please advise 

## 2017-09-08 NOTE — Telephone Encounter (Signed)
Dawn [name] with [facility] called stating that they had requesting a refill for CALCIUM 200 mg-MAGNESIUM 100 mg-VITAMIN D 100 IU PO last week and wanted to check on the status. Dawn [name] stated she would re-fax the refill request to be completed or the medication could be called into if possible. CB# [phone]. Please advise. Thanks TNP

## 2017-09-29 ENCOUNTER — Ambulatory Visit
Admission: RE | Admit: 2017-09-29 | Discharge: 2017-09-29 | Disposition: A | Discharge status: Home / Self Care | Payer: BLUE CROSS/BLUE SHIELD | Source: Ambulatory Visit | Attending: Pulmonary Disease | Admitting: Pulmonary Disease

## 2017-09-29 DIAGNOSIS — R918 Other nonspecific abnormal finding of lung field: Secondary | ICD-10-CM | POA: Diagnosis not present

## 2017-09-29 DIAGNOSIS — I700 Atherosclerosis of aorta: Secondary | ICD-10-CM | POA: Insufficient documentation

## 2017-09-29 DIAGNOSIS — I2510 Atherosclerotic heart disease of native coronary artery without angina pectoris: Secondary | ICD-10-CM | POA: Insufficient documentation

## 2017-09-29 DIAGNOSIS — R0602 Shortness of breath: Secondary | ICD-10-CM

## 2017-09-30 NOTE — Telephone Encounter (Signed)
Rx was already called in Sep 04, 2017. Per pharmacist.

## 2017-09-30 NOTE — Telephone Encounter (Signed)
Can call in one capsule twice a day, #180. rf x 4

## 2017-09-30 NOTE — Telephone Encounter (Signed)
I've not received any faxes for this patient.

## 2017-09-30 NOTE — Telephone Encounter (Signed)
Please advise refill? 

## 2017-10-12 ENCOUNTER — Ambulatory Visit (INDEPENDENT_AMBULATORY_CARE_PROVIDER_SITE_OTHER): Payer: BLUE CROSS/BLUE SHIELD | Admitting: Pulmonary Disease

## 2017-10-12 ENCOUNTER — Encounter: Payer: Self-pay | Admitting: Pulmonary Disease

## 2017-10-12 ENCOUNTER — Ambulatory Visit: Payer: BLUE CROSS/BLUE SHIELD | Admitting: Pulmonary Disease

## 2017-10-12 VITALS — BP 118/76 | HR 84 | Ht 65.0 in | Wt 131.6 lb

## 2017-10-12 DIAGNOSIS — J45909 Unspecified asthma, uncomplicated: Secondary | ICD-10-CM

## 2017-10-12 DIAGNOSIS — R0602 Shortness of breath: Secondary | ICD-10-CM | POA: Diagnosis not present

## 2017-10-12 DIAGNOSIS — D4709 Other mast cell neoplasms of uncertain behavior: Secondary | ICD-10-CM | POA: Diagnosis not present

## 2017-10-12 LAB — PULMONARY FUNCTION TEST
DL/VA % pred: 107 %
DL/VA: 5.29 ml/min/mmHg/L
DLCO UNC: 23.31 ml/min/mmHg
DLCO cor % pred: 87 %
DLCO cor: 22.28 ml/min/mmHg
DLCO unc % pred: 91 %
FEF 25-75 Post: 1.66 L/sec
FEF 25-75 Pre: 1.68 L/sec
FEF2575-%CHANGE-POST: -1 %
FEF2575-%PRED-PRE: 62 %
FEF2575-%Pred-Post: 62 %
FEV1-%CHANGE-POST: 2 %
FEV1-%PRED-PRE: 63 %
FEV1-%Pred-Post: 64 %
FEV1-POST: 1.81 L
FEV1-Pre: 1.77 L
FEV1FVC-%CHANGE-POST: -11 %
FEV1FVC-%Pred-Pre: 96 %
FEV6-%CHANGE-POST: 7 %
FEV6-%Pred-Post: 71 %
FEV6-%Pred-Pre: 66 %
FEV6-PRE: 2.32 L
FEV6-Post: 2.48 L
FEV6FVC-%PRED-PRE: 103 %
FEV6FVC-%Pred-Post: 103 %
FVC-%CHANGE-POST: 15 %
FVC-%PRED-POST: 74 %
FVC-%PRED-PRE: 64 %
FVC-POST: 2.68 L
FVC-PRE: 2.32 L
POST FEV1/FVC RATIO: 68 %
Post FEV6/FVC ratio: 100 %
Pre FEV1/FVC ratio: 76 %
Pre FEV6/FVC Ratio: 100 %

## 2017-10-12 NOTE — Progress Notes (Signed)
[facility]  Chief Complaint  Patient presents with  . Follow-up    Pt has PFT prior to OV today, was not able to complete pleth part of PFT. Pt has SOB with exertion with wheezing 2 weeks ago, pt is better today at her baseline.    Vital signs: BP 118/76 (BP Location: Left Arm, Cuff Size: Normal)   Pulse 84   Ht 5' 5"  (1.651 m)   Wt 131 lb 9.6 oz (59.7 kg)   SpO2 98%   BMI 21.90 kg/m   History of Present Illness: Dawn [name] is a 52 y.o. female former smoker with allergic asthma and mast cell disorder.    She has appointment with Dr. Bernerd Limbo to assess mast cell disorder.  Her PFT today shows combined obstructive and restrictive defect.    She feels symptoms are at baseline and manageable.  She isn't having much wheeze, sputum, or chest pain.  Does get episodes of air hunger, but inhalers help with this.  Physical Exam:  General - pleasant Eyes - pupils reactive ENT - no sinus tenderness, no oral exudate, no LAN Cardiac - regular, no murmur Chest - no wheeze, rales Abd - soft, non tender Ext - no edema Skin - no rashes Neuro - normal strength Psych - normal mood  Discussion: She has hx of allergic asthma and rhinitis with progression in her symptoms.  She had CT chest in 2017 that showed upper lobe opacities and pectus excavatum.  She has hx of psoriatic arthritis, Sjogren's, and inflammatory bowel disease.  She is on chronic prednisone for IBD.  She was intolerant of MTX previously.  She reports hx of mast cell disorder, but not clear what was involved with this.  -She would like assessment for xolair if she is suitable candidate  Assessment/Plan:  Allergic asthma and rhinitis. - symbicort, proair, OTC antihistamine  Mast cell disorder. - she has appointment with Dr. Bernerd Limbo in [place] in October 2019  Lung nodule. - f/u CT chest w/o contrast for June 2020  Hx of psoriatic arthritis, Sjogren's, IBD. - she is  maintained on prednisone   Patient Instructions  Follow up in 5 months   Chesley Mires, MD [facility] 10/12/2017, 1:01 PM  Flow Sheet  Pulmonary tests: RAST 02/23/14 >> positive CT chest 07/11/15 >> minimal CAD, upper lobe patchy opacities Rt > Lt, pectus excavatum Quantiferon gold 06/18/16 >> negative RAST 08/17/17 >> dust mites, mold CT chest 09/29/17 >> GGO RUL no change, 6 mm nodule LUL PFT 10/12/17 >> FEV1 1.81 (64%), FEV1% 68, unable to do lung volumes, DLCO 91%, +BD  Past Medical History: She  has a past medical history of Acquired hypothyroidism (12/11/2014), Allergy to pollen (02/23/2006), Anxiety, Asthma, Asthma, chronic (02/26/2014), Asthma, exogenous (02/23/2006), Crohn's disease of both small and large intestine with abscess ([place]) (04/28/2005), Fatigue (12/11/2014), Headache (12/11/2014), History of anaphylaxis (12/11/2014), History of insect sting allergy (02/26/2014), colonic polyps, UTI (urinary tract infection), Osteoporosis due to androgen therapy (02/23/2006), Raynaud's phenomenon (12/11/2014), Rosacea (12/11/2014), Tobacco abuse (04/29/2003), and Ulcerative colitis.  Past Surgical History: She  has a past surgical history that includes Shoulder surgery and Electromyography (06/13/15).  Family History: Her family history includes Cancer in her mother; Colon cancer in her paternal grandmother; Heart disease in her father; Irritable bowel syndrome in her mother; Prostate cancer in her father; Renal cancer in her maternal grandmother.  Social History: She  reports that she quit smoking about 22 years ago.  Her smoking use included cigarettes. She has a 3.00 pack-year smoking history. She has never used smokeless tobacco. She reports that she drinks alcohol. She reports that she does not use drugs.   Review of systems: 12 point ROS negative except in HPI  Medications: Allergies as of 10/12/2017      Reactions   Codeine    Erythromycin    Other    ANTICHOLINERGIC  MEDICATIONS    Oxycodone Other (See Comments)   Oxycodone-acetaminophen Other (See Comments), Nausea And Vomiting   Penicillins    Sugar-protein-starch    Sulfa Drugs Cross Reactors       Medication List        Accurate as of 10/12/17  1:01 PM. Always use your most recent med list.          budesonide-formoterol 160-4.5 MCG/ACT inhaler Commonly known as:  SYMBICORT Inhale 2 puffs into the lungs 2 (two) times daily.   CALCIUM-MAGNESIUM-VITAMIN D PO Take 1 capsule by mouth 2 (two) times daily.   EPINEPHrine 0.3 mg/0.3 mL Soaj injection Commonly known as:  EPI-PEN   glucose blood test strip   IRON SUPPLEMENT PO Take 1 tablet by mouth daily.   levothyroxine 75 MCG tablet Commonly known as:  SYNTHROID, LEVOTHROID Take 1 tablet (75 mcg total) by mouth daily. compound   LORazepam 0.5 MG tablet Commonly known as:  ATIVAN TAKE 1 TABLET BY MOUTH ONCE OR TWICE DAILY AS NEEDED FOR ANXIETY   predniSONE 5 MG tablet Commonly known as:  DELTASONE TAKE 1 TABLET(5 MG) BY MOUTH DAILY   PROAIR HFA 108 (90 Base) MCG/ACT inhaler Generic drug:  albuterol INHALE 2 PUFFS INTO THE LUNGS EVERY 6 HOURS AS NEEDED FOR WHEEZING OR SHORTNESS OF BREATH

## 2017-10-12 NOTE — Progress Notes (Signed)
PFT completed 10/12/17

## 2017-10-12 NOTE — Patient Instructions (Signed)
Follow up in 5 months

## 2017-10-25 ENCOUNTER — Other Ambulatory Visit: Payer: Self-pay | Admitting: Family Medicine

## 2017-10-25 DIAGNOSIS — F418 Other specified anxiety disorders: Secondary | ICD-10-CM

## 2017-10-26 DIAGNOSIS — A049 Bacterial intestinal infection, unspecified: Secondary | ICD-10-CM | POA: Diagnosis not present

## 2017-10-26 DIAGNOSIS — K580 Irritable bowel syndrome with diarrhea: Secondary | ICD-10-CM | POA: Diagnosis not present

## 2017-10-26 DIAGNOSIS — T783XXA Angioneurotic edema, initial encounter: Secondary | ICD-10-CM | POA: Diagnosis not present

## 2017-12-10 ENCOUNTER — Other Ambulatory Visit: Payer: Self-pay | Admitting: Family Medicine

## 2017-12-10 DIAGNOSIS — F418 Other specified anxiety disorders: Secondary | ICD-10-CM

## 2018-01-11 ENCOUNTER — Other Ambulatory Visit: Payer: Self-pay | Admitting: Family Medicine

## 2018-01-11 MED ORDER — LEVOTHYROXINE SODIUM 75 MCG PO TABS: 75.0000 ug | ORAL_TABLET | Freq: Every day | ORAL | 1 refills | Status: DC

## 2018-01-11 NOTE — Telephone Encounter (Signed)
[facility] faxed a refill request for the following medication. Thanks CC  levothyroxine (SYNTHROID, LEVOTHROID) 75 MCG tablet

## 2018-01-14 ENCOUNTER — Other Ambulatory Visit: Payer: Self-pay | Admitting: Family Medicine

## 2018-01-14 NOTE — Telephone Encounter (Signed)
RX for levothyroxine was sent to wrong pharmacy. Needs to be resent to [facility]. Patient scheduled follow ov for 02/05/2018.

## 2018-01-14 NOTE — Telephone Encounter (Signed)
[facility] faxed a refill request for the following medication. Thanks CC  levothyroxine (SYNTHROID, LEVOTHROID) 75 MCG tablet

## 2018-01-15 MED ORDER — LEVOTHYROXINE SODIUM 75 MCG PO TABS: 75.0000 ug | ORAL_TABLET | Freq: Every day | ORAL | 5 refills | Status: DC

## 2018-01-15 NOTE — Telephone Encounter (Signed)
I think this is a compounded medication for this patient. It needs to be called in to make sure the refill the same formulation that she has been receiving in the past.

## 2018-01-15 NOTE — Telephone Encounter (Signed)
Rx was phoned into [facility].

## 2018-02-05 ENCOUNTER — Encounter: Payer: Self-pay | Admitting: Family Medicine

## 2018-02-05 ENCOUNTER — Ambulatory Visit: Payer: BLUE CROSS/BLUE SHIELD | Admitting: Family Medicine

## 2018-02-05 VITALS — BP 122/78 | HR 88 | Temp 97.5°F | Resp 16 | Wt 134.0 lb

## 2018-02-05 DIAGNOSIS — Z7952 Long term (current) use of systemic steroids: Secondary | ICD-10-CM

## 2018-02-05 DIAGNOSIS — D8940 Mast cell activation, unspecified: Secondary | ICD-10-CM

## 2018-02-05 DIAGNOSIS — E039 Hypothyroidism, unspecified: Secondary | ICD-10-CM | POA: Diagnosis not present

## 2018-02-05 DIAGNOSIS — M8580 Other specified disorders of bone density and structure, unspecified site: Secondary | ICD-10-CM

## 2018-02-05 DIAGNOSIS — J45909 Unspecified asthma, uncomplicated: Secondary | ICD-10-CM

## 2018-02-05 NOTE — Progress Notes (Signed)
Patient: Dawn [name] Female    DOB: 08/05/1965   52 y.o.   MRN: [id number] Visit Date: 02/05/2018  Today's Provider: Lelon Huh, MD   Chief Complaint  Patient presents with  . Hypothyroidism  . Asthma  . Anxiety   Subjective:    Thyroid Problem  Presents for follow-up visit. Patient reports no anxiety, cold intolerance, constipation, depressed mood, diaphoresis, diarrhea, dry skin, fatigue, hair loss, heat intolerance, hoarse voice, leg swelling, menstrual problem, nail problem, palpitations, tremors, visual change, weight gain or weight loss. The symptoms have been stable.     Situational Anxiety- From 08/18/2016. No changes. Continue current medications. State lorazepam continues to work well.   Chronic Asthma- From 08/18/2016. No changes. Symptoms are well controlled on chronic steroid. She states she is now being followed by Dr. Halford Chessman at [facility] instead of Dr. Raul Del.     Osteopenia- From 08/18/2016. Bone denisity was ordered. However, this has still not been done. She is still on low dose daily prednisone.   Hypothyroidism- From greater than 1 year ago. Patient was started on levothyroxine 14mcg daily.     Lab Results  Component Value Date   TSH 13.490 (H) 01/14/2016        Allergies  Allergen Reactions  . Codeine   . Erythromycin   . Other     ANTICHOLINERGIC MEDICATIONS   . Oxycodone Other (See Comments)  . Oxycodone-Acetaminophen Other (See Comments) and Nausea And Vomiting  . Penicillins   . Sugar-Protein-Starch   . Sulfa Drugs Cross Reactors      Current Outpatient Medications:  .  budesonide-formoterol (SYMBICORT) 160-4.5 MCG/ACT inhaler, Inhale 2 puffs into the lungs 2 (two) times daily., Disp: 1 Inhaler, Rfl: 6 .  CALCIUM-MAGNESIUM-VITAMIN D PO, Take 1 capsule by mouth 2 (two) times daily.  , Disp: , Rfl:  .  EPINEPHrine 0.3 mg/0.3 mL IJ SOAJ injection, , Disp: , Rfl:  .  Ferrous Sulfate (IRON SUPPLEMENT PO), Take 1 tablet by  mouth daily., Disp: , Rfl:  .  levothyroxine (SYNTHROID, LEVOTHROID) 75 MCG tablet, Take 1 tablet (75 mcg total) by mouth daily. compounded, Disp: 30 tablet, Rfl: 5 .  loratadine (CLARITIN) 10 MG tablet, Take 10 mg by mouth 3 (three) times daily as needed for allergies., Disp: , Rfl:  .  LORazepam (ATIVAN) 0.5 MG tablet, TAKE 1 TABLET BY MOUTH 1 TO 2 TIMES DAILY AS NEEDED FOR ANXIETY, Disp: 60 tablet, Rfl: 5 .  predniSONE (DELTASONE) 5 MG tablet, TAKE 1 TABLET(5 MG) BY MOUTH DAILY, Disp: 30 tablet, Rfl: 11 .  PROAIR HFA 108 (90 Base) MCG/ACT inhaler, INHALE 2 PUFFS INTO THE LUNGS EVERY 6 HOURS AS NEEDED FOR WHEEZING OR SHORTNESS OF BREATH, Disp: 8.5 g, Rfl: 5 .  ranitidine (ZANTAC) 75 MG tablet, Take 75 mg by mouth daily., Disp: , Rfl:  .  glucose blood test strip, , Disp: , Rfl:   Review of Systems  Constitutional: Negative.  Negative for diaphoresis, fatigue, weight gain and weight loss.  HENT: Negative for hoarse voice.   Respiratory: Negative.   Cardiovascular: Negative.  Negative for palpitations.  Gastrointestinal: Negative.  Negative for constipation and diarrhea.  Endocrine: Negative for cold intolerance, heat intolerance, polydipsia, polyphagia and polyuria.  Genitourinary: Negative for menstrual problem.  Skin: Negative.   Neurological: Negative for tremors.  Psychiatric/Behavioral: The patient is not nervous/anxious.     Social History   Tobacco Use  . Smoking status: Former Smoker  Packs/day: 0.30    Years: 10.00    Pack years: 3.00    Types: Cigarettes    Last attempt to quit: 01/16/1995    Years since quitting: 23.0  . Smokeless tobacco: Never Used  Substance Use Topics  . Alcohol use: Yes    Alcohol/week: 0.0 standard drinks    Comment: occasional   Objective:   BP 122/78 (BP Location: Left Arm, Patient Position: Sitting, Cuff Size: Normal)   Pulse 88   Temp (!) 97.5 F (36.4 C) (Oral)   Resp 16   Wt 134 lb (60.8 kg)   BMI 22.30 kg/m  Vitals:   02/05/18  0908  BP: 122/78  Pulse: 88  Resp: 16  Temp: (!) 97.5 F (36.4 C)  TempSrc: Oral  Weight: 134 lb (60.8 kg)     Physical Exam   General Appearance:    Alert, cooperative, no distress  Eyes:    PERRL, conjunctiva/corneas clear, EOM's intact       Lungs:     Clear to auscultation bilaterally, respirations unlabored  Heart:    Regular rate and rhythm  Neurologic:   Awake, alert, oriented x 3. No apparent focal neurological           defect.           Assessment & Plan:     1. Acquired hypothyroidism She feels well on current dose of levothyroxine. She anticipates thorough blood work when she sees Dr. Melissa Montane in [place] later this month. She feels the mast cell disorder may be related to thyroid disorder and would like to hold off an checking labs here since she will be having tests done in [place]  2. Current chronic use of systemic steroids Is overdue for BMD which was ordered today.  - DG Bone Density; Future  3. Mast cell activation syndrome ([place]) (by patient report) has appointment with Dr. Bernerd Limbo who specializes in this disorder in a couple of weeks.   4. Chronic asthma without complication, unspecified asthma severity, unspecified whether persistent Doing well current regiment.   5. Osteopenia, unspecified location Needs BMD as above.   She declined flu vaccine due to history of several egg allergy.        Lelon Huh, MD  [facility]

## 2018-03-09 ENCOUNTER — Other Ambulatory Visit: Payer: BLUE CROSS/BLUE SHIELD

## 2018-03-24 ENCOUNTER — Other Ambulatory Visit: Payer: BLUE CROSS/BLUE SHIELD

## 2018-04-22 ENCOUNTER — Telehealth: Payer: Self-pay

## 2018-04-22 ENCOUNTER — Other Ambulatory Visit: Payer: BLUE CROSS/BLUE SHIELD

## 2018-04-22 DIAGNOSIS — Z7952 Long term (current) use of systemic steroids: Secondary | ICD-10-CM

## 2018-04-22 DIAGNOSIS — E2839 Other primary ovarian failure: Secondary | ICD-10-CM

## 2018-04-22 DIAGNOSIS — E039 Hypothyroidism, unspecified: Secondary | ICD-10-CM

## 2018-04-22 NOTE — Telephone Encounter (Signed)
Pt advised.   Thanks,   -Laura  

## 2018-04-22 NOTE — Telephone Encounter (Signed)
Order printed, please leave at lab.

## 2018-04-22 NOTE — Telephone Encounter (Signed)
Patient needs new BMD order, her will expire before she can get the test done.  Patient prefers [place] location.  Also needs order for thyroid labs done.  CB# [phone]

## 2018-04-23 DIAGNOSIS — E039 Hypothyroidism, unspecified: Secondary | ICD-10-CM | POA: Diagnosis not present

## 2018-04-24 LAB — T4, FREE: FREE T4: 1.48 ng/dL (ref 0.82–1.77)

## 2018-04-24 LAB — TSH: TSH: 5.87 u[IU]/mL — AB (ref 0.450–4.500)

## 2018-04-26 ENCOUNTER — Telehealth: Payer: Self-pay

## 2018-04-26 NOTE — Telephone Encounter (Signed)
-----   Message from Birdie Sons, MD sent at 04/26/2018  7:55 AM EST ----- Is slightly hypothyroid. If she feels well then can continue same dose of levothyroxine. If she is feeling more fatigued, then she could increase levothyroxine to 100 mcg daily (#30 rf x 3 and check tsh in 2 months)

## 2018-04-26 NOTE — Telephone Encounter (Signed)
Patient was advised and states she is feeling better and will keep taking her same dose (45mcg). Patient states she will call back when she is able to come back in for a recheck due to starting another job.

## 2018-05-11 ENCOUNTER — Inpatient Hospital Stay: Admission: RE | Admit: 2018-05-11 | Payer: BLUE CROSS/BLUE SHIELD | Source: Ambulatory Visit

## 2018-05-20 ENCOUNTER — Ambulatory Visit
Admission: RE | Admit: 2018-05-20 | Discharge: 2018-05-20 | Disposition: A | Discharge status: Home / Self Care | Payer: BLUE CROSS/BLUE SHIELD | Source: Ambulatory Visit | Attending: Family Medicine | Admitting: Family Medicine

## 2018-05-20 ENCOUNTER — Telehealth: Payer: Self-pay

## 2018-05-20 ENCOUNTER — Encounter: Payer: Self-pay | Admitting: Family Medicine

## 2018-05-20 DIAGNOSIS — Z7952 Long term (current) use of systemic steroids: Secondary | ICD-10-CM | POA: Diagnosis not present

## 2018-05-20 DIAGNOSIS — M810 Age-related osteoporosis without current pathological fracture: Secondary | ICD-10-CM

## 2018-05-20 MED ORDER — ALENDRONATE SODIUM 70 MG PO TABS: 70.0000 mg | ORAL_TABLET | ORAL | 3 refills | Status: DC

## 2018-05-20 NOTE — Telephone Encounter (Signed)
-----   Message from Birdie Sons, MD sent at 05/20/2018 12:57 PM EST ----- bmd shows osteoporosis in hips and spine. Need to start alendronate 70mg  once a week, #12, rf x 3 and follow up in 3 months.

## 2018-05-20 NOTE — Telephone Encounter (Signed)
Patient advised and agrees with treatment plan. Prescription sent into the pharmacy. Three month f/u appointment scheduled for 08/23/2018 at 8am.

## 2018-07-01 ENCOUNTER — Other Ambulatory Visit: Payer: Self-pay | Admitting: Family Medicine

## 2018-07-01 DIAGNOSIS — F418 Other specified anxiety disorders: Secondary | ICD-10-CM

## 2018-07-01 DIAGNOSIS — J45909 Unspecified asthma, uncomplicated: Secondary | ICD-10-CM

## 2018-07-01 DIAGNOSIS — K50814 Crohn's disease of both small and large intestine with abscess: Secondary | ICD-10-CM

## 2018-07-01 NOTE — Telephone Encounter (Signed)
Patient requesting a refill 

## 2018-07-09 ENCOUNTER — Other Ambulatory Visit: Payer: Self-pay | Admitting: Family Medicine

## 2018-07-12 ENCOUNTER — Other Ambulatory Visit: Payer: Self-pay | Admitting: Family Medicine

## 2018-07-12 MED ORDER — EPINEPHRINE 0.3 MG/0.3ML IJ SOAJ: 0.3000 mg | INTRAMUSCULAR | 1 refills | Status: DC | PRN

## 2018-07-15 ENCOUNTER — Other Ambulatory Visit: Payer: Self-pay | Admitting: Family Medicine

## 2018-07-16 ENCOUNTER — Other Ambulatory Visit: Payer: Self-pay | Admitting: Family Medicine

## 2018-07-16 ENCOUNTER — Telehealth: Payer: Self-pay | Admitting: Family Medicine

## 2018-07-16 DIAGNOSIS — E039 Hypothyroidism, unspecified: Secondary | ICD-10-CM

## 2018-07-16 MED ORDER — NONFORMULARY OR COMPOUNDED ITEM: 12 refills | Status: DC

## 2018-07-16 NOTE — Telephone Encounter (Signed)
Pharmacy will not fill pt's Rx -levothyroxine Because it's not noted to be Compounded  Pt's asking if it can be corrected/called in today at:  [facility], [place] [phone] (Phone) [phone] (Fax)   Thanks, [employer]

## 2018-07-16 NOTE — Progress Notes (Signed)
Correction to thyroid formulation. Prescription faxed.

## 2018-07-16 NOTE — Telephone Encounter (Signed)
Please review. Thanks!  

## 2018-08-05 ENCOUNTER — Ambulatory Visit: Payer: BLUE CROSS/BLUE SHIELD | Admitting: Family Medicine

## 2018-08-23 ENCOUNTER — Ambulatory Visit: Payer: Self-pay | Admitting: Family Medicine

## 2018-11-12 ENCOUNTER — Other Ambulatory Visit: Payer: Self-pay

## 2018-11-12 DIAGNOSIS — J45909 Unspecified asthma, uncomplicated: Secondary | ICD-10-CM

## 2018-11-12 DIAGNOSIS — K50814 Crohn's disease of both small and large intestine with abscess: Secondary | ICD-10-CM

## 2018-11-12 NOTE — Telephone Encounter (Signed)
Patient called requesting a refill on Prednisone 5 MG. L.O.V. was 02/05/2018,please advise.

## 2018-11-13 MED ORDER — PREDNISONE 5 MG PO TABS: ORAL_TABLET | ORAL | 5 refills | Status: DC

## 2018-11-20 ENCOUNTER — Other Ambulatory Visit: Payer: Self-pay | Admitting: Family Medicine

## 2018-11-20 DIAGNOSIS — E039 Hypothyroidism, unspecified: Secondary | ICD-10-CM

## 2018-11-20 DIAGNOSIS — J45909 Unspecified asthma, uncomplicated: Secondary | ICD-10-CM

## 2018-11-20 DIAGNOSIS — K50814 Crohn's disease of both small and large intestine with abscess: Secondary | ICD-10-CM

## 2018-11-20 NOTE — Telephone Encounter (Signed)
Patient was prescribed alendronate in January for osteoporosis, which is caused by prednisone. Has she been taking alendronate. I don't see that [facility] reporting it as being dispensed.

## 2018-11-23 ENCOUNTER — Telehealth: Payer: Self-pay

## 2018-11-23 NOTE — Telephone Encounter (Signed)
Signed fax and sent to medical records.

## 2018-11-23 NOTE — Telephone Encounter (Signed)
Pt states she is taking the Alendronate regularly.  She is also taking Calcium-magnesium-vitamin D compounded three times a day.     Thanks,   -Mickel Baas

## 2018-11-23 NOTE — Telephone Encounter (Signed)
Pt states she needs a refill on her compounded Calcium 200mg , Vitamin D 100 units, and Magnesium 100mg .  She takes 2 capsules three times a day.    Pt says she uses [facility] in [place], [place].  Phone number: [phone]   Thanks,   -Mickel Baas

## 2018-11-25 ENCOUNTER — Other Ambulatory Visit: Payer: Self-pay | Admitting: Family Medicine

## 2018-11-25 DIAGNOSIS — E039 Hypothyroidism, unspecified: Secondary | ICD-10-CM

## 2018-11-25 MED ORDER — NONFORMULARY OR COMPOUNDED ITEM: 12 refills | Status: DC

## 2018-11-25 NOTE — Telephone Encounter (Signed)
Pt needing refills on:  NONFORMULARY OR COMPOUNDED ITEM - Vit D - Magnesium - Calcium  Please call into:  [facility], [place] [phone] (Phone) [phone] (Fax)   Thanks, [place]

## 2018-11-25 NOTE — Telephone Encounter (Signed)
I signed a fax request for this on Monday. I guess they didn't get it. It can't go electronically because it is compounded. Ok to call pharmacy and approve refill for a year.

## 2018-11-25 NOTE — Telephone Encounter (Signed)
Please review. Thanks!  

## 2018-11-26 NOTE — Telephone Encounter (Signed)
I called the pharmacy and they have the request.  They will refill the prescription.    Thanks,    -Mickel Baas

## 2018-11-29 ENCOUNTER — Other Ambulatory Visit: Payer: Self-pay

## 2018-11-29 ENCOUNTER — Telehealth: Payer: Self-pay | Admitting: Pulmonary Disease

## 2018-11-29 ENCOUNTER — Encounter: Payer: Self-pay | Admitting: Nurse Practitioner

## 2018-11-29 ENCOUNTER — Ambulatory Visit (INDEPENDENT_AMBULATORY_CARE_PROVIDER_SITE_OTHER): Payer: BC Managed Care – PPO | Admitting: Nurse Practitioner

## 2018-11-29 DIAGNOSIS — J45909 Unspecified asthma, uncomplicated: Secondary | ICD-10-CM | POA: Diagnosis not present

## 2018-11-29 MED ORDER — PREDNISONE 10 MG PO TABS: 20.0000 mg | ORAL_TABLET | Freq: Every day | ORAL | 0 refills | Status: AC

## 2018-11-29 MED ORDER — MONTELUKAST SODIUM 10 MG PO TABS: 10.0000 mg | ORAL_TABLET | Freq: Every day | ORAL | 11 refills | Status: DC

## 2018-11-29 NOTE — Patient Instructions (Addendum)
Allergic asthma and rhinitis: -will order Singulair -will order a prescription of prednisone to have in case of acute flare - symbicort, proair, OTC antihistamine  Mast cell disorder: - she has upcoming appointment with Dr. Bernerd Limbo in [place]  Lung nodule: - f/u CT chest w/o contrast was supposed to have been June 2020, but this has not been completed  Hx of psoriatic arthritis, Sjogren's, IBD: - she is maintained on prednisone  Follow up: Patient has follow up with Dr. Halford Chessman scheduled

## 2018-11-29 NOTE — Telephone Encounter (Signed)
Primary Pulmonologist: VS Last office visit and with whom: 10/12/17 with VS What do we see them for (pulmonary problems): asthma Last OV assessment/plan: Assessment/Plan:  Allergic asthma and rhinitis. - symbicort, proair, OTC antihistamine  Mast cell disorder. - she has appointment with Dr. Bernerd Limbo in [place] in October 2019  Lung nodule. - f/u CT chest w/o contrast for June 2020  Hx of psoriatic arthritis, Sjogren's, IBD. - she is maintained on prednisone   Patient Instructions  Follow up in 5 months Was appointment offered to patient (explain)?  Pt wanting to start on med   Reason for call: Called and spoke with pt who stated she had a flare up which began 1 week ago that became better but then two days ago she started having a flare up again.  Pt does not have any fever and stated that when COVID outbreak began, pt has mainly been staying at home and also has been working from home.  Pt said she wants to discuss a higher dose of prednisone due to the flare up and also said she has had to use her rescue inhaler. Pt said with the asthma flare, she needed to take 20mg  prednisone yesterday.  Pt also wanted to know if a prescription of singulair to see if that would help with her symptoms.  Pt has had symptoms of SOB, occ wheezing, and tightness in chest. Stated to pt that we needed to get her scheduled for a televisit to further evaluate and pt verbalized understanding. Visit scheduled with TN at 2pm today. Nothing further needed.  (examples of things to ask: : When did symptoms start? Fever? Cough? Productive? Color to sputum? More sputum than usual? Wheezing? Have you needed increased oxygen? Are you taking your respiratory medications? What over the counter measures have you tried?)

## 2018-11-29 NOTE — Assessment & Plan Note (Signed)
Patient had recent flare. Self treated with 20 mg of prednisone. Feeling much improved today.   Patient Instructions  Allergic asthma and rhinitis: -will order Singulair -will order a prescription of prednisone to have in case of acute flare - symbicort, proair, OTC antihistamine  Mast cell disorder: - she has upcoming appointment with Dr. Bernerd Limbo in [place]  Lung nodule: - f/u CT chest w/o contrast was supposed to have been June 2020, but this has not been completed  Hx of psoriatic arthritis, Sjogren's, IBD: - she is maintained on prednisone  Follow up: Patient has follow up with Dr. Halford Chessman scheduled

## 2018-11-29 NOTE — Progress Notes (Signed)
Virtual Visit via Telephone Note  I connected with Dawn [name] on 11/29/18 at  2:00 PM EDT by telephone and verified that I am speaking with the correct person using two identifiers.  Location: Patient: home Provider: office   I discussed the limitations, risks, security and privacy concerns of performing an evaluation and management service by telephone and the availability of in person appointments. I also discussed with the patient that there may be a patient responsible charge related to this service. The patient expressed understanding and agreed to proceed.   History of Present Illness: 53 year old female former smoker with allergic asthma and mast cell disorder who is followed by Dr. Halford Chessman.  Patient has a tele-visit today for an acute visit.  She states that over the past 2 days she has had an asthma flare.  She states that she was having shortness of breath and wheezing.  She does have a cough that is nonproductive.  She did use her albuterol as needed and is compliant with Symbicort.  Patient is on 5 mg of prednisone daily but has taken 20 mg of prednisone for the past 2 days with moderate relief noted.  Patient is awaiting a referral to see Dr. Bernerd Limbo to assess mast cell disorder but unfortunately this appointment has been postponed due to current COVID pandemic.  Patient states that she is feeling much better today but does request to possibly start on Singulair daily and also request some prednisone to have on hand if needed for an acute flare. Denies f/c/s, n/v/d, hemoptysis, PND, leg swelling.    Observations/Objective: RAST 02/23/14 >> positive CT chest 07/11/15 >> minimal CAD, upper lobe patchy opacities Rt > Lt, pectus excavatum Quantiferon gold 06/18/16 >> negative RAST 08/17/17 >> dust mites, mold CT chest 09/29/17 >> GGO RUL no change, 6 mm nodule LUL PFT 10/12/17 >> FEV1 1.81 (64%), FEV1% 68, unable to do lung volumes, DLCO 91%, +BD  Assessment and Plan: Patient had  recent flare. Self treated with 20 mg of prednisone. Feeling much improved today.   Patient Instructions  Allergic asthma and rhinitis: -will order Singulair -will order a prescription of prednisone to have in case of acute flare - symbicort, proair, OTC antihistamine  Mast cell disorder: - she has upcoming appointment with Dr. Bernerd Limbo in [place]  Lung nodule: - f/u CT chest w/o contrast was supposed to have been June 2020, but this has not been completed  Hx of psoriatic arthritis, Sjogren's, IBD: - she is maintained on prednisone    Follow Up Instructions: Patient has follow up with Dr. Halford Chessman scheduled    I discussed the assessment and treatment plan with the patient. The patient was provided an opportunity to ask questions and all were answered. The patient agreed with the plan and demonstrated an understanding of the instructions.   The patient was advised to call back or seek an in-person evaluation if the symptoms worsen or if the condition fails to improve as anticipated.  I provided 22 minutes of non-face-to-face time during this encounter.   Fenton Foy, NP

## 2018-12-18 NOTE — Progress Notes (Signed)
Reviewed and agree with assessment/plan.   Tajha Sammarco, MD [facility] 04/23/2016, 12:24 PM Pager:  [phone]  

## 2019-02-07 ENCOUNTER — Other Ambulatory Visit: Payer: Self-pay | Admitting: Family Medicine

## 2019-02-07 DIAGNOSIS — F418 Other specified anxiety disorders: Secondary | ICD-10-CM

## 2019-02-21 ENCOUNTER — Encounter: Payer: Self-pay | Admitting: Emergency Medicine

## 2019-02-21 ENCOUNTER — Ambulatory Visit
Admission: EM | Admit: 2019-02-21 | Discharge: 2019-02-21 | Disposition: A | Discharge status: Home / Self Care | Payer: BC Managed Care – PPO | Attending: Internal Medicine | Admitting: Internal Medicine

## 2019-02-21 ENCOUNTER — Other Ambulatory Visit: Payer: Self-pay

## 2019-02-21 DIAGNOSIS — S61230A Puncture wound without foreign body of right index finger without damage to nail, initial encounter: Secondary | ICD-10-CM

## 2019-02-21 DIAGNOSIS — W540XXA Bitten by dog, initial encounter: Secondary | ICD-10-CM

## 2019-02-21 MED ORDER — DOXYCYCLINE HYCLATE 100 MG PO CAPS: 100.0000 mg | ORAL_CAPSULE | Freq: Two times a day (BID) | ORAL | 0 refills | Status: AC

## 2019-02-21 MED ORDER — CLINDAMYCIN HCL 300 MG PO CAPS: 300.0000 mg | ORAL_CAPSULE | Freq: Three times a day (TID) | ORAL | 0 refills | Status: AC

## 2019-02-21 NOTE — ED Triage Notes (Signed)
Patient states she was bitten by her rescue dog last week. Patient states she is not sure when she had a tDap but is concered about that.  The bite is healing well animal is up to date on shots.  Patient states she is hypersensitive to many vaccinations.

## 2019-02-21 NOTE — ED Provider Notes (Signed)
[facility]    CSN: [id number] Arrival date & time: 02/21/19  1403      History   Chief Complaint Chief Complaint  Patient presents with  . Animal Bite    HPI Dawn [name] is a 53 y.o. female comes to the urgent care with a history of dog bite 5 days ago.  Patient has been the owner of the dog last year.  Dog was obtained from rescue shelter.  Immunizations are up-to-date.  Patient sustained dog bite and was an unprovoked bite.  Patient has been cleaning the wound with soap.  She has some swelling in the right index finger.  Patient sustained a bite on the right index finger.  No fever or chills.  No surrounding erythema at this time.  No numbness or tingling. HPI  Past Medical History:  Diagnosis Date  . Acquired hypothyroidism 12/11/2014  . Allergy to pollen 02/23/2006  . Anxiety   . Asthma   . Asthma, chronic 02/26/2014   Onset as teenager   . Asthma, exogenous 02/23/2006  . Crohn's disease of both small and large intestine with abscess ([place]) 04/28/2005   (Ulcerative Colitis)   . Fatigue 12/11/2014  . Headache 12/11/2014  . History of anaphylaxis 12/11/2014  . History of insect sting allergy 02/26/2014   Hymenoptera, carries EpiPen   . Hx of colonic polyps   . Hx: UTI (urinary tract infection)   . Osteoporosis due to androgen therapy 02/23/2006  . Raynaud's phenomenon 12/11/2014  . Rosacea 12/11/2014  . Tobacco abuse 04/29/2003  . Ulcerative colitis     Patient Active Problem List   Diagnosis Date Noted  . 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 Current chronic use of systemic steroids 08/18/2016  . DOE (dyspnea on exertion) 11/02/2015  . Bilateral headaches 11/01/2015  . Weakness 11/01/2015  . Arthralgia 12/11/2014  . Headache 12/11/2014  . History of anaphylaxis 12/11/2014  . Acquired hypothyroidism 12/11/2014  . Raynaud's phenomenon 12/11/2014  . Rosacea 12/11/2014  . Fatigue 12/11/2014  . Asthma, chronic 02/26/2014  . History of insect  sting allergy 02/26/2014  . Allergy to pollen 02/23/2006  . Osteoporosis 02/23/2006  . Asthma, exogenous 02/23/2006  . Crohn's disease of both small and large intestine with abscess ([place]) 04/28/2005  . Nicotine dependence 04/29/2003  . Situational anxiety 04/28/1998    Past Surgical History:  Procedure Laterality Date  . ELECTROMYOGRAPHY  06/13/15   UE EMG Normal  . SHOULDER SURGERY      OB History   No obstetric history on file.      Home Medications    Prior to Admission medications   Medication Sig Start Date End Date Taking? Authorizing Provider  albuterol (PROVENTIL HFA;VENTOLIN HFA) 108 (90 Base) MCG/ACT inhaler INHALE 2 PUFFS INTO THE LUNGS EVERY 6 HOURS AS NEEDED FOR WHEEZING OR SHORTNESS OF BREATH 07/12/18  Yes Birdie Sons, MD  alendronate (FOSAMAX) 70 MG tablet Take 1 tablet (70 mg total) by mouth once a week. Take with a full glass of water on an empty stomach. 05/20/18  Yes Birdie Sons, MD  budesonide-formoterol [facility]) 160-4.5 MCG/ACT inhaler Inhale 2 puffs into the lungs 2 (two) times daily. 08/17/17  Yes Chesley Mires, MD  CALCIUM-MAGNESIUM-VITAMIN D PO Take 1 capsule by mouth 2 (two) times daily.     Yes [provider]  EPINEPHrine 0.3 mg/0.3 mL IJ SOAJ injection Inject 0.3 mLs (0.3 mg total) into the muscle as needed for anaphylaxis. 07/12/18  Yes Fisher,  Kirstie Peri, MD  Ferrous Sulfate (IRON SUPPLEMENT PO) Take 1 tablet by mouth daily.   Yes [provider]  glucose blood test strip  04/02/06  Yes [provider]  loratadine (CLARITIN) 10 MG tablet Take 10 mg by mouth 3 (three) times daily as needed for allergies.   Yes [provider]  LORazepam (ATIVAN) 0.5 MG tablet TAKE 1 TABLET BY MOUTH 1 TO 2 TIMES DAILY AS NEEDED FOR ANXIETY 02/07/19  Yes Birdie Sons, MD  NONFORMULARY OR COMPOUNDED ITEM VoThy(T4) 35mg caps. One capsule daily 11/25/18  Yes FBirdie Sons MD  predniSONE (DELTASONE) 5 MG tablet TAKE 1 TABLET(5  MG) BY MOUTH DAILY 11/24/18  Yes FBirdie Sons MD  clindamycin (CLEOCIN) 300 MG capsule Take 1 capsule (300 mg total) by mouth 3 (three) times daily for 5 days. 02/21/19 02/26/19  Lamptey,Myrene Galas MD  doxycycline (VIBRAMYCIN) 100 MG capsule Take 1 capsule (100 mg total) by mouth 2 (two) times daily for 5 days. 02/21/19 02/26/19  LChase Picket MD  ranitidine (ZANTAC) 75 MG tablet Take 75 mg by mouth daily.    [provider]  montelukast (SINGULAIR) 10 MG tablet Take 1 tablet (10 mg total) by mouth at bedtime. 11/29/18 02/21/19  NFenton Foy NP    Family History Family History  Problem Relation Age of Onset  . Cancer Mother        gallbladder  . Irritable bowel syndrome Mother   . Prostate cancer Father   . Heart disease Father   . Colon cancer Paternal Grandmother   . Renal cancer Maternal Grandmother     Social History Social History   Tobacco Use  . Smoking status: Former Smoker    Packs/day: 0.30    Years: 10.00    Pack years: 3.00    Types: Cigarettes    Quit date: 01/16/1995    Years since quitting: 24.1  . Smokeless tobacco: Never Used  Substance Use Topics  . Alcohol use: Yes    Alcohol/week: 0.0 standard drinks    Comment: occasional  . Drug use: No     Allergies   Codeine, Erythromycin, Other, Oxycodone, Oxycodone-acetaminophen, Penicillins, Sugar-protein-starch, and Sulfa drugs cross reactors   Review of Systems Review of Systems  Constitutional: Negative.   HENT: Negative.   Eyes: Negative.   Respiratory: Negative.   Cardiovascular: Negative.   Gastrointestinal: Negative.   Genitourinary: Negative.   Musculoskeletal: Positive for joint swelling. Negative for arthralgias and myalgias.  Skin: Positive for wound. Negative for color change, pallor and rash.  Neurological: Negative for dizziness, tremors, weakness, numbness and headaches.     Physical Exam Triage Vital Signs ED Triage Vitals  Enc Vitals Group     BP 02/21/19  1423 (!) 149/101     Pulse Rate 02/21/19 1423 91     Resp 02/21/19 1423 18     Temp 02/21/19 1423 98.5 F (36.9 C)     Temp Source 02/21/19 1423 Oral     SpO2 02/21/19 1423 98 %     Weight 02/21/19 1422 122 lb (55.3 kg)     Height 02/21/19 1422 5' 5"  (1.651 m)     Head Circumference --      Peak Flow --      Pain Score 02/21/19 1421 2     Pain Loc --      Pain Edu? --      Excl. in [place] --    No data found.  Updated Vital Signs BP (!) 149/101 (BP Location: Left Arm)   Pulse 91   Temp 98.5 F (36.9 C) (Oral)   Resp 18   Ht 5' 5"  (1.651 m)   Wt 55.3 kg   SpO2 98%   BMI 20.30 kg/m   Visual Acuity Right Eye Distance:   Left Eye Distance:   Bilateral Distance:    Right Eye Near:   Left Eye Near:    Bilateral Near:     Physical Exam Constitutional:      General: She is not in acute distress.    Appearance: Normal appearance. She is not ill-appearing.  Cardiovascular:     Rate and Rhythm: Normal rate and regular rhythm.     Pulses: Normal pulses.     Heart sounds: Normal heart sounds.  Pulmonary:     Effort: Pulmonary effort is normal.     Breath sounds: Normal breath sounds.  Abdominal:     General: Abdomen is flat. Bowel sounds are normal. There is no distension.     Palpations: Abdomen is soft.  Skin:    Capillary Refill: Capillary refill takes less than 2 seconds.     Comments: Puncture wound on the right index finger.  Mild swelling of the right index finger. No discharge or erythema.ROM is normal in the right index finger.  Neurological:     General: No focal deficit present.     Mental Status: She is alert and oriented to person, place, and time. Mental status is at baseline.     Cranial Nerves: No cranial nerve deficit.     Sensory: No sensory deficit.  Psychiatric:        Mood and Affect: Mood normal.      UC Treatments / Results  Labs (all labs ordered are listed, but only abnormal results are displayed) Labs Reviewed - No data to display  EKG    Radiology No results found.  Procedures Procedures (including critical care time)  Medications Ordered in UC Medications - No data to display  Initial Impression / Assessment and Plan / UC Course  I have reviewed the triage vital signs and the nursing notes.  Pertinent labs & imaging results that were available during my care of the patient were reviewed by me and considered in my medical decision making (see chart for details).     1.  Dog bite involving the right index finger:  Patient refuses tetanus shot at this time because of prior history of reaction to vaccination. Clindamycin 300 mg 3 times daily for 5 days and doxycycline 100 mg twice daily for 5 days If patient develops erythema swelling purulent discharge or worsening pain in the right index finger, she is advised to return to urgent care to be reevaluated. Final Clinical Impressions(s) / UC Diagnoses   Final diagnoses:  Dog bite, initial encounter   Discharge Instructions   None    ED Prescriptions    Medication Sig Dispense Auth. Provider   clindamycin (CLEOCIN) 300 MG capsule Take 1 capsule (300 mg total) by mouth 3 (three) times daily for 5 days. 15 capsule Lamptey, Myrene Galas, MD   doxycycline (VIBRAMYCIN) 100 MG capsule Take 1 capsule (100 mg total) by mouth 2 (two) times daily for 5 days. 10 capsule Lamptey, Myrene Galas, MD     PDMP not reviewed this encounter.   Chase Picket, MD 02/23/19 [phone]

## 2019-04-07 ENCOUNTER — Telehealth: Payer: Self-pay | Admitting: Family Medicine

## 2019-04-07 NOTE — Telephone Encounter (Signed)
Medication refill:  predniSONE (DELTASONE) 5 MG tablet [[id number]] LORazepam (ATIVAN) 0.5 MG tablet [[id number]]    Would like 5 refills of each    Pharmacy:  [facility] [place], [address] AT [place] Phone:  [phone]  Fax:  [phone]

## 2019-04-13 ENCOUNTER — Telehealth: Payer: Self-pay | Admitting: Family Medicine

## 2019-04-13 DIAGNOSIS — J45909 Unspecified asthma, uncomplicated: Secondary | ICD-10-CM

## 2019-04-13 DIAGNOSIS — K50814 Crohn's disease of both small and large intestine with abscess: Secondary | ICD-10-CM

## 2019-04-13 DIAGNOSIS — F418 Other specified anxiety disorders: Secondary | ICD-10-CM

## 2019-04-13 NOTE — Telephone Encounter (Signed)
Patient is requesting a refill on the calcium 277m/ Vitamin D 100 IU/ magnesium 1017msupplement. Patient reports taking 2 capsules three times daily with meals. She says Dr. FiCaryn Sectionas been prescribing this for years. She has to get it at a specialty pharmacy because she is allergic to the fillers in the OTC brands.   Patient also asked about refills on Prednisone and Ativan. She says she called our office and requested refills on them 1 week ago, but hasn't heard back yet. Her last office visit was 02/05/2018. She told me that she does not want to come into the doctors office now because of COVID and her being high risk.  Please advise.

## 2019-04-13 NOTE — Telephone Encounter (Signed)
Van faxed refill request for the following medications:  CALCIUM-MAGNESIUM-VITAMIN D PO  Last Rx: I don't see that we have filled this before LOV: 02/05/2018 Please advise. Thanks TNP

## 2019-04-14 MED ORDER — PREDNISONE 5 MG PO TABS: 5.0000 mg | ORAL_TABLET | Freq: Every day | ORAL | 5 refills | Status: DC

## 2019-04-14 MED ORDER — LORAZEPAM 0.5 MG PO TABS: ORAL_TABLET | ORAL | 1 refills | Status: DC

## 2019-04-21 ENCOUNTER — Other Ambulatory Visit: Payer: Self-pay | Admitting: Family Medicine

## 2019-04-21 DIAGNOSIS — K50814 Crohn's disease of both small and large intestine with abscess: Secondary | ICD-10-CM

## 2019-04-21 DIAGNOSIS — J45909 Unspecified asthma, uncomplicated: Secondary | ICD-10-CM

## 2019-04-21 NOTE — Telephone Encounter (Signed)
Please review for Dr. Caryn Section.

## 2019-04-21 NOTE — Telephone Encounter (Signed)
Requested medication (s) are due for refill today: no  Requested medication (s) are on the active medication list: yes  Last refill:  03/23/2019  Future visit scheduled: no  Notes to clinic:  refill cannot be delegated    Requested Prescriptions  Pending Prescriptions Disp Refills   predniSONE (DELTASONE) 5 MG tablet [Pharmacy Med Name: PREDNISONE 5MG TABLETS] 30 tablet 5    Sig: TAKE 1 TABLET(5 MG) BY MOUTH DAILY      Not Delegated - Endocrinology:  Oral Corticosteroids Failed - 04/21/2019  3:30 AM      Failed - This refill cannot be delegated      Failed - Last BP in normal range    BP Readings from Last 1 Encounters:  02/21/19 (!) 149/101          Failed - Valid encounter within last 6 months    Recent Outpatient Visits           1 year ago Acquired hypothyroidism   [facility] Birdie Sons, MD   2 years ago Situational anxiety   [facility] Birdie Sons, MD   2 years ago Neck pain   [facility] Collet M, [place]   3 years ago Acquired hypothyroidism   [facility] Birdie Sons, MD   3 years ago Bilateral headaches   [facility] Birdie Sons, MD

## 2019-04-26 ENCOUNTER — Other Ambulatory Visit: Payer: Self-pay | Admitting: Family Medicine

## 2019-04-26 ENCOUNTER — Other Ambulatory Visit: Payer: Self-pay

## 2019-04-26 ENCOUNTER — Telehealth: Payer: Self-pay

## 2019-04-26 MED ORDER — CORAL CALCIUM-MAGNESIUM-VIT D 200-100-100 MG-MG-UNIT PO CAPS: 2.0000 | ORAL_CAPSULE | Freq: Three times a day (TID) | ORAL | 3 refills | Status: DC

## 2019-04-26 NOTE — Telephone Encounter (Signed)
Patient is requesting medication prescribed by a historical provider .

## 2019-04-26 NOTE — Telephone Encounter (Signed)
The patie

## 2019-04-26 NOTE — Telephone Encounter (Signed)
The patient called to get her compounded Calcium refilled.  She states you have been doing this for her for years.  I was unable to see where you had refilled what she said she had been taking.  She states she is on Calcium 200 mg/Vit D 100 iu/Magnesium 100mg  and takes 2 TID.  I have placed it in the med list the best I can but it will not let me send it to the pharmacy.   She wants it sent to the pharmacy in [facility] that is in her profile.    Sorry, I tried to do it for you but I couldn't do it.

## 2019-04-26 NOTE — Telephone Encounter (Signed)
Medication Refill - Medication: CALCIUM-MAGNESIUM-VITAMIN D PO    Has the patient contacted their pharmacy? Yes.   (Agent: If no, request that the patient contact the pharmacy for the refill.) (Agent: If yes, when and what did the pharmacy advise?)  Preferred Pharmacy (with phone number or street name):  [place], [place] - [facility]  [address] [place], [place] [address]  Phone: [phone] Fax: [phone]     Agent: Please be advised that RX refills may take up to 3 business days. We ask that you follow-up with your pharmacy.

## 2019-04-27 NOTE — Telephone Encounter (Signed)
Refill called in to pharmacy

## 2019-04-27 NOTE — Telephone Encounter (Signed)
has to be called in. See refill in Media from 12/24/2018. can call in same rx and sig for #180 and 12 refills.

## 2019-05-18 ENCOUNTER — Telehealth: Payer: Self-pay | Admitting: Family Medicine

## 2019-05-18 NOTE — Telephone Encounter (Signed)
Patient would like to know if Dr. Caryn Section would be willing to write her a letter of exemption for vaccinations as she is immunocompromised. Patient requesting call back to discuss further, if needed.   Patient requesting this be sent through [place].

## 2019-05-18 NOTE — Telephone Encounter (Signed)
I called patient to get more information. She states she need a letter of statement from Dr. Caryn Section stating that she should not get vaccines because she is immunocompromised. 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 They are requiring all remote employees to be vaccinated the same as their non remote employees. Patient states she had a titer done about 15 years ago when she worked with [employer] that showed she was not immune to Varicella.  Patient states that they will require her to get this unless she has a doctors statement saying why she shouldn't get it. Patient states she is not going to get any vaccines because of her history. She would like this letter entered into [facility] so that she can print it off and fax to her employer. Please advise.

## 2019-05-19 NOTE — Telephone Encounter (Signed)
Pt would like for Dr. Caryn Section to add to the letter that pt can have a work physical waived. They are requiring a work physical and because she is across the country and will not be going on site at any time she does not feel she needs the physical. 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 Please add this to the letter. Pt filled out the waiver and HR needs a note from pt's PCP.

## 2019-05-20 NOTE — Telephone Encounter (Signed)
Pt called stating she is no longer needing this done. Thank you. Please advise.

## 2019-06-22 ENCOUNTER — Telehealth: Payer: Self-pay

## 2019-06-22 NOTE — Telephone Encounter (Signed)
ERROR

## 2019-07-18 ENCOUNTER — Other Ambulatory Visit: Payer: Self-pay | Admitting: Family Medicine

## 2019-07-18 ENCOUNTER — Other Ambulatory Visit: Payer: Self-pay | Admitting: *Deleted

## 2019-07-18 DIAGNOSIS — E039 Hypothyroidism, unspecified: Secondary | ICD-10-CM

## 2019-07-18 DIAGNOSIS — F418 Other specified anxiety disorders: Secondary | ICD-10-CM

## 2019-07-18 MED ORDER — LORAZEPAM 0.5 MG PO TABS: ORAL_TABLET | ORAL | 1 refills | Status: DC

## 2019-07-18 NOTE — Telephone Encounter (Signed)
Please call in the compounded thyroid medication, cannot be e-prescribed.

## 2019-07-18 NOTE — Telephone Encounter (Signed)
Per inittial request, "Refill request for  is calling and said the pharm sent the refill request. Pt needs a refill on levothyroxine 75 mcg caps compound [facility] [place] and [facility]epam [facility] [address] last office visit 02/05/18; no upcoming visits noted; last TSH/T4 04/23/18; will route to office for final disposition. Requested medication (s) are due for refill today: Lorazepam, yes  Requested medication (s) are on the active medication list: yes  Last refill: 04/14/2019   Future visit scheduled: no  Notes to clinic:  Not delegated; no valid encouner within last 6 months

## 2019-07-18 NOTE — Telephone Encounter (Addendum)
Per inittial request, "Refill request for  is calling and said the pharm sent the refill request. Pt needs a refill on levothyroxine 75 mcg caps compound [facility] [place] and [facility]am [facility] [address] last office visit 02/05/18; no upcoming visits noted; last TSH/T4 04/23/18; will route to office for final disposition.  Requested medication (s) are due for refill today: Levothyroxine, yes  Requested medication (s) are on the active medication list: no  Last refill: 11/25/2018  Future visit scheduled: no  Notes to clinic: no valid encounter within last 12 months

## 2019-07-18 NOTE — Telephone Encounter (Signed)
Pt is calling and said the pharm sent the refill request. Pt needs a refill on levothyroxine 75 mcg caps compound [facility] [place] and [facility]epam [facility] [address]

## 2019-07-19 NOTE — Telephone Encounter (Signed)
RX called in at [facility]

## 2019-07-20 MED ORDER — LORAZEPAM 0.5 MG PO TABS: ORAL_TABLET | ORAL | 1 refills | Status: DC

## 2019-07-20 NOTE — Addendum Note (Signed)
Addended by: Birdie Sons on: 07/20/2019 11:57 AM   Modules accepted: Orders

## 2019-07-20 NOTE — Telephone Encounter (Signed)
Pt called and stated that the medication lorazepam was called in to the wrong drugstore and need to be called into  [facility] [place], [place] - [address] AT [place] Phone:  [phone]  Fax:  [phone]

## 2019-07-20 NOTE — Addendum Note (Signed)
Addended by: Jules Schick on: 07/20/2019 09:59 AM   Modules accepted: Orders

## 2019-07-29 ENCOUNTER — Ambulatory Visit: Payer: BC Managed Care – PPO | Admitting: Physician Assistant

## 2019-07-29 ENCOUNTER — Other Ambulatory Visit: Payer: Self-pay

## 2019-07-29 VITALS — BP 128/90 | HR 73 | Temp 97.1°F | Ht 65.0 in | Wt 123.4 lb

## 2019-07-29 DIAGNOSIS — K50814 Crohn's disease of both small and large intestine with abscess: Secondary | ICD-10-CM | POA: Diagnosis not present

## 2019-07-29 DIAGNOSIS — R300 Dysuria: Secondary | ICD-10-CM | POA: Diagnosis not present

## 2019-07-29 LAB — POCT URINALYSIS DIPSTICK
Bilirubin, UA: NEGATIVE
Glucose, UA: NEGATIVE
Ketones, UA: NEGATIVE
Nitrite, UA: NEGATIVE
Protein, UA: NEGATIVE
Spec Grav, UA: 1.01 (ref 1.010–1.025)
Urobilinogen, UA: 0.2 E.U./dL
pH, UA: 7.5 (ref 5.0–8.0)

## 2019-07-29 MED ORDER — CIPROFLOXACIN HCL 250 MG PO TABS: 250.0000 mg | ORAL_TABLET | Freq: Two times a day (BID) | ORAL | 0 refills | Status: AC

## 2019-07-29 NOTE — Patient Instructions (Signed)

## 2019-07-29 NOTE — Progress Notes (Signed)
Patient: Dawn [name] Female    DOB: [birth year]   54 y.o.   MRN: [id number] Visit Date: 07/29/2019  Today's Provider: Trinna Post, PA-C   Chief Complaint  Patient presents with  . Urinary Tract Infection   Subjective:     Urinary Frequency  This is a new problem. The current episode started in the past 7 days. The problem occurs every urination. The problem has been unchanged. The quality of the pain is described as burning. The pain is at a severity of 6/10. The pain is mild. There has been no fever. She is not sexually active. Associated symptoms include frequency, hesitancy and urgency. Pertinent negatives include no nausea or vomiting. She has tried nothing for the symptoms.   History of crohn's disease. Reports intermittent cloudy urine x 2 weeks. This morning she reports her urine was very cloudy that improved somewhat with drinking water. Difficulty with urination. Only would urinate slightly and has increased urinary frequency.   Allergies  Allergen Reactions  . Codeine   . Erythromycin   . Other     ANTICHOLINERGIC MEDICATIONS   . Oxycodone Other (See Comments)  . Oxycodone-Acetaminophen Other (See Comments) and Nausea And Vomiting  . Penicillins   . Sugar-Protein-Starch   . Sulfa Drugs Cross Reactors      Current Outpatient Medications:  .  albuterol (PROVENTIL HFA;VENTOLIN HFA) 108 (90 Base) MCG/ACT inhaler, INHALE 2 PUFFS INTO THE LUNGS EVERY 6 HOURS AS NEEDED FOR WHEEZING OR SHORTNESS OF BREATH, Disp: 8.5 g, Rfl: 5 .  alendronate (FOSAMAX) 70 MG tablet, Take 1 tablet (70 mg total) by mouth once a week. Take with a full glass of water on an empty stomach., Disp: 12 tablet, Rfl: 3 .  budesonide-formoterol (SYMBICORT) 160-4.5 MCG/ACT inhaler, Inhale 2 puffs into the lungs 2 (two) times daily., Disp: 1 Inhaler, Rfl: 6 .  CALCIUM-MAGNESIUM-VITAMIN D PO, Take 1 capsule by mouth 2 (two) times daily.  , Disp: , Rfl:  .  Coral Calcium-Magnesium-Vit D 200-100-100  MG-MG-UNIT CAPS, Take 2 each by mouth 3 (three) times daily., Disp: 540 capsule, Rfl: 3 .  EPINEPHrine 0.3 mg/0.3 mL IJ SOAJ injection, Inject 0.3 mLs (0.3 mg total) into the muscle as needed for anaphylaxis., Disp: 2 Device, Rfl: 1 .  Ferrous Sulfate (IRON SUPPLEMENT PO), Take 1 tablet by mouth daily., Disp: , Rfl:  .  loratadine (CLARITIN) 10 MG tablet, Take 10 mg by mouth 3 (three) times daily as needed for allergies., Disp: , Rfl:  .  LORazepam (ATIVAN) 0.5 MG tablet, TAKE 1 TABLET BY MOUTH 1 TO 2 TIMES DAILY AS NEEDED FOR ANXIETY, Disp: 60 tablet, Rfl: 1 .  NONFORMULARY OR COMPOUNDED ITEM, VoThy(T4) 44mg caps. One capsule daily, Disp: 30 each, Rfl: 12 .  predniSONE (DELTASONE) 5 MG tablet, TAKE 1 TABLET(5 MG) BY MOUTH DAILY, Disp: 30 tablet, Rfl: 5 .  glucose blood test strip, , Disp: , Rfl:  .  ranitidine (ZANTAC) 75 MG tablet, Take 75 mg by mouth daily., Disp: , Rfl:   Review of Systems  Constitutional: Negative.   HENT: Negative.   Eyes: Negative.   Respiratory: Negative.   Cardiovascular: Negative.   Gastrointestinal: Negative.  Negative for nausea and vomiting.  Endocrine: Negative.   Genitourinary: Positive for frequency, hesitancy and urgency.  Musculoskeletal: Negative.   Skin: Negative.   Allergic/Immunologic: Negative.   Neurological: Negative.   Hematological: Negative.   Psychiatric/Behavioral: Negative.     Social History  Tobacco Use  . Smoking status: Former Smoker    Packs/day: 0.30    Years: 10.00    Pack years: 3.00    Types: Cigarettes    Quit date: 01/16/1995    Years since quitting: 24.5  . Smokeless tobacco: Never Used  Substance Use Topics  . Alcohol use: Yes    Alcohol/week: 0.0 standard drinks    Comment: occasional      Objective:   There were no vitals taken for this visit. There were no vitals filed for this visit.There is no height or weight on file to calculate BMI.   Physical Exam Constitutional:      General: She is not in  acute distress.    Appearance: She is well-developed. She is not diaphoretic.  Cardiovascular:     Rate and Rhythm: Normal rate and regular rhythm.  Pulmonary:     Effort: Pulmonary effort is normal.     Breath sounds: Normal breath sounds.  Abdominal:     General: Bowel sounds are normal. There is no distension.     Palpations: Abdomen is soft.     Tenderness: There is abdominal tenderness in the suprapubic area. There is no guarding or rebound.  Skin:    General: Skin is warm and dry.  Neurological:     Mental Status: She is alert and oriented to person, place, and time.  Psychiatric:        Behavior: Behavior normal.      No results found for any visits on 07/29/19.     Assessment & Plan    1. Dysuria  - POCT Urinalysis Dipstick - CULTURE, URINE COMPREHENSIVE  2. Crohn's disease of both small and large intestine with abscess ([place])  The entirety of the information documented in the History of Present Illness, Review of Systems and Physical Exam were personally obtained by me. Portions of this information were initially documented by [facility] and reviewed by me for thoroughness and accuracy.   F/u Prn       Trinna Post, PA-C  [facility]

## 2019-08-01 ENCOUNTER — Telehealth: Payer: Self-pay

## 2019-08-01 ENCOUNTER — Other Ambulatory Visit: Payer: Self-pay

## 2019-08-01 DIAGNOSIS — R300 Dysuria: Secondary | ICD-10-CM

## 2019-08-01 LAB — CULTURE, URINE COMPREHENSIVE

## 2019-08-01 MED ORDER — DOXYCYCLINE HYCLATE 100 MG PO TABS: 100.0000 mg | ORAL_TABLET | Freq: Two times a day (BID) | ORAL | 0 refills | Status: DC

## 2019-08-01 NOTE — Telephone Encounter (Signed)
Copied from [place] [phone]. Topic: General - Other >> Aug 01, 2019  2:05 PM Yvette Rack wrote: Reason for CRM: Pt stated she is feeling worst than she did on Friday when she had the appt. Pt stated she saw online that a Rx for doxycycline will be sent to her pharmacy but she has not received an update on the Rx. Pt requests that the Rx be sent asap to [facility] [place], [address]

## 2019-08-01 NOTE — Telephone Encounter (Signed)
Patient was advised and medication was send into the pharmacy.

## 2019-08-30 NOTE — Progress Notes (Signed)
Established patient visit   Patient: Dawn [name]   DOB: August 13, 1965   54 y.o. Female  MRN: [id number] Visit Date: 08/31/2019  Today's healthcare provider: Lelon Huh, MD   Chief Complaint  Patient presents with  . Anxiety  . Hypothyroidism   I,Latasha Walston,acting as a scribe for Lelon Huh, MD.,have documented all relevant documentation on the behalf of Lelon Huh, MD,as directed by  Lelon Huh, MD while in the presence of Lelon Huh, MD.  Subjective    HPI  Anxiety, Follow-up  She was last seen for anxiety more than 1 year ago.  Changes made at last visit include continue same medication.   She reports good compliance with treatment. She reports good tolerance of treatment. She is not having side effects.   She feels her anxiety is moderate and Unchanged since last visit.  Symptoms: No chest pain No difficulty concentrating No dizziness No fatigue No feelings of losing control No insomnia No irritable No palpitations No panic attacks No racing thoughts No shortness of breath No sweating No tremors/shakes  GAD-7 Results GAD-7 Generalized Anxiety Disorder Screening Tool 08/31/2019  1. Feeling Nervous, Anxious, or on Edge 0  2. Not Being Able to Stop or Control Worrying 1  3. Worrying Too Much About Different Things 1  4. Trouble Relaxing 0  5. Being So Restless it's Hard To Sit Still 0  6. Becoming Easily Annoyed or Irritable 1  7. Feeling Afraid As If Something Awful Might Happen 0  Total GAD-7 Score 3  Difficulty At Work, Home, or Getting  Along With Others? Not difficult at all    PHQ-9 Scores PHQ9 SCORE ONLY 08/31/2019  Score 0    ------------------------------------------------------------------------------- Hypothyroid, follow-up  Lab Results  Component Value Date   TSH 5.870 (H) 04/23/2018   TSH 13.490 (H) 01/14/2016   TSH 6.70 (A) 02/03/2014   FREET4 1.48 04/23/2018   T4TOTAL 6.8 01/14/2016   Wt Readings from Last 3  Encounters:  08/31/19 123 lb 3.2 oz (55.9 kg)  07/29/19 123 lb 6.4 oz (56 kg)  02/21/19 122 lb (55.3 kg)    She was last seen for hypothyroid more than 1 year ago.  Management since that visit includes continue same medication. She reports good compliance with treatment. She is not having side effects.   Symptoms: No change in energy level No constipation No diarrhea No heat / cold intolerance No nervousness No palpitations No weight changes  -----------------------------------------------------------------------------------------       Medications: Outpatient Medications Prior to Visit  Medication Sig  . albuterol (PROVENTIL HFA;VENTOLIN HFA) 108 (90 Base) MCG/ACT inhaler INHALE 2 PUFFS INTO THE LUNGS EVERY 6 HOURS AS NEEDED FOR WHEEZING OR SHORTNESS OF BREATH  . alendronate (FOSAMAX) 70 MG tablet Take 1 tablet (70 mg total) by mouth once a week. Take with a full glass of water on an empty stomach.  . budesonide-formoterol (SYMBICORT) 160-4.5 MCG/ACT inhaler Inhale 2 puffs into the lungs 2 (two) times daily.  Marland Kitchen CALCIUM-MAGNESIUM-VITAMIN D PO Take 1 capsule by mouth 2 (two) times daily.    Marland Kitchen Coral Calcium-Magnesium-Vit D 200-100-100 MG-MG-UNIT CAPS Take 2 each by mouth 3 (three) times daily.  Marland Kitchen EPINEPHrine 0.3 mg/0.3 mL IJ SOAJ injection Inject 0.3 mLs (0.3 mg total) into the muscle as needed for anaphylaxis.  . Ferrous Sulfate (IRON SUPPLEMENT PO) Take 1 tablet by mouth daily.  Marland Kitchen loratadine (CLARITIN) 10 MG tablet Take 10 mg by mouth 3 (three) times daily as needed for allergies.  Marland Kitchen  LORazepam (ATIVAN) 0.5 MG tablet TAKE 1 TABLET BY MOUTH 1 TO 2 TIMES DAILY AS NEEDED FOR ANXIETY  . NONFORMULARY OR COMPOUNDED ITEM VoThy(T4) 79mg caps. One capsule daily  . predniSONE (DELTASONE) 5 MG tablet TAKE 1 TABLET(5 MG) BY MOUTH DAILY  . ranitidine (ZANTAC) 75 MG tablet Take 75 mg by mouth daily.  . [DISCONTINUED] doxycycline (VIBRA-TABS) 100 MG tablet Take 1 tablet (100 mg total) by  mouth 2 (two) times daily.  .Marland Kitchenglucose blood test strip    No facility-administered medications prior to visit.    Review of Systems  Constitutional: Negative for appetite change, chills, fatigue and fever.  Respiratory: Negative for chest tightness and shortness of breath.   Cardiovascular: Negative for chest pain and palpitations.  Gastrointestinal: Negative for abdominal pain, nausea and vomiting.  Neurological: Negative for dizziness and weakness.      Objective    BP 115/77 (BP Location: Right Arm, Patient Position: Sitting, Cuff Size: Normal)   Pulse 81   Temp (!) 96.9 F (36.1 C) (Temporal)   Wt 123 lb 3.2 oz (55.9 kg)   BMI 20.50 kg/m    Physical Exam   General: Appearance:    Well developed, well nourished female in no acute distress  Eyes:    PERRL, conjunctiva/corneas clear, EOM's intact       Lungs:     Clear to auscultation bilaterally, respirations unlabored  Heart:    Normal heart rate. Normal rhythm. No murmurs, rubs, or gallops.   MS:   All extremities are intact.   Neurologic:   Awake, alert, oriented x 3. No apparent focal neurological           defect.         No results found for any visits on 08/31/19.  Assessment & Plan     1. Situational anxiety refill - LORazepam (ATIVAN) 0.5 MG tablet; TAKE 1 TABLET BY MOUTH 1 TO 2 TIMES DAILY AS NEEDED FOR ANXIETY  Dispense: 60 tablet; Refill: 3  2. Acquired hypothyroidism  - Comprehensive metabolic panel - TSH - T4, free  3. Chronic asthma without complication, unspecified asthma severity, unspecified whether persistent  - predniSONE (DELTASONE) 5 MG tablet; TAKE 1 TABLET(5 MG) BY MOUTH DAILY  Dispense: 30 tablet; Refill: 5  4. Crohn's disease of both small and large intestine with [place]  - predniSONE (DELTASONE) 5 MG tablet; TAKE 1 TABLET(5 MG) BY MOUTH DAILY  Dispense: 30 tablet; Refill: 5 - Comprehensive metabolic panel - CBC  5. Osteoporosis, unspecified osteoporosis type,  unspecified pathological fracture presence  - alendronate (FOSAMAX) 70 MG tablet; Take 1 tablet (70 mg total) by mouth once a week. Take with a full glass of water on an empty stomach.  Dispense: 12 tablet; Refill: 3  6. Encounter for special screening examination for cardiovascular disorder  - Lipid panel     No follow-ups on file.      The entirety of the information documented in the History of Present Illness, Review of Systems and Physical Exam were personally obtained by me. Portions of this information were initially documented by the CMA and reviewed by me for thoroughness and accuracy.      DLelon Huh MD  [phone](phone) [phone](fax)  [place]

## 2019-08-31 ENCOUNTER — Encounter: Payer: Self-pay | Admitting: Family Medicine

## 2019-08-31 ENCOUNTER — Ambulatory Visit: Payer: BC Managed Care – PPO | Admitting: Family Medicine

## 2019-08-31 ENCOUNTER — Other Ambulatory Visit: Payer: Self-pay

## 2019-08-31 VITALS — BP 115/77 | HR 81 | Temp 96.9°F | Wt 123.2 lb

## 2019-08-31 DIAGNOSIS — Z136 Encounter for screening for cardiovascular disorders: Secondary | ICD-10-CM | POA: Diagnosis not present

## 2019-08-31 DIAGNOSIS — K50814 Crohn's disease of both small and large intestine with abscess: Secondary | ICD-10-CM | POA: Diagnosis not present

## 2019-08-31 DIAGNOSIS — E039 Hypothyroidism, unspecified: Secondary | ICD-10-CM

## 2019-08-31 DIAGNOSIS — J45909 Unspecified asthma, uncomplicated: Secondary | ICD-10-CM

## 2019-08-31 DIAGNOSIS — F418 Other specified anxiety disorders: Secondary | ICD-10-CM

## 2019-08-31 DIAGNOSIS — M810 Age-related osteoporosis without current pathological fracture: Secondary | ICD-10-CM

## 2019-08-31 MED ORDER — LORAZEPAM 0.5 MG PO TABS: ORAL_TABLET | ORAL | 3 refills | Status: DC

## 2019-08-31 MED ORDER — PREDNISONE 5 MG PO TABS: ORAL_TABLET | ORAL | 5 refills | Status: DC

## 2019-08-31 MED ORDER — ALBUTEROL SULFATE HFA 108 (90 BASE) MCG/ACT IN AERS: INHALATION_SPRAY | RESPIRATORY_TRACT | 5 refills | Status: DC

## 2019-08-31 MED ORDER — ALENDRONATE SODIUM 70 MG PO TABS: 70.0000 mg | ORAL_TABLET | ORAL | 3 refills | Status: DC

## 2019-08-31 MED ORDER — EPINEPHRINE 0.3 MG/0.3ML IJ SOAJ: 0.3000 mg | INTRAMUSCULAR | 4 refills | Status: DC | PRN

## 2019-08-31 NOTE — Patient Instructions (Signed)
.   Please review the attached list of medications and notify my office if there are any errors.   . Please bring all of your medications to every appointment so we can make sure that our medication list is the same as yours.   . Please call the [facility] [phone]) to schedule a routine screening mammogram.

## 2019-09-01 ENCOUNTER — Telehealth: Payer: Self-pay

## 2019-09-01 DIAGNOSIS — E039 Hypothyroidism, unspecified: Secondary | ICD-10-CM

## 2019-09-01 LAB — COMPREHENSIVE METABOLIC PANEL
ALT: 6 IU/L (ref 0–32)
AST: 13 IU/L (ref 0–40)
Albumin/Globulin Ratio: 2.2 (ref 1.2–2.2)
Albumin: 4.2 g/dL (ref 3.8–4.9)
Alkaline Phosphatase: 59 IU/L (ref 39–117)
BUN/Creatinine Ratio: 15 (ref 9–23)
BUN: 11 mg/dL (ref 6–24)
Bilirubin Total: 0.7 mg/dL (ref 0.0–1.2)
CO2: 24 mmol/L (ref 20–29)
Calcium: 9.2 mg/dL (ref 8.7–10.2)
Chloride: 104 mmol/L (ref 96–106)
Creatinine, Ser: 0.73 mg/dL (ref 0.57–1.00)
GFR calc Af Amer: 107 mL/min/{1.73_m2} (ref >59)
GFR calc non Af Amer: 93 mL/min/{1.73_m2} (ref >59)
Globulin, Total: 1.9 g/dL (ref 1.5–4.5)
Glucose: 51 mg/dL — ABNORMAL LOW (ref 65–99)
Potassium: 4.1 mmol/L (ref 3.5–5.2)
Sodium: 146 mmol/L — ABNORMAL HIGH (ref 134–144)
Total Protein: 6.1 g/dL (ref 6.0–8.5)

## 2019-09-01 LAB — CBC
Hematocrit: 44.9 % (ref 34.0–46.6)
Hemoglobin: 14.7 g/dL (ref 11.1–15.9)
MCH: 30.6 pg (ref 26.6–33.0)
MCHC: 32.7 g/dL (ref 31.5–35.7)
MCV: 94 fL (ref 79–97)
Platelets: 275 10*3/uL (ref 150–450)
RBC: 4.8 x10E6/uL (ref 3.77–5.28)
RDW: 13.1 % (ref 11.7–15.4)
WBC: 9 10*3/uL (ref 3.4–10.8)

## 2019-09-01 LAB — LIPID PANEL
Chol/HDL Ratio: 2.6 ratio (ref 0.0–4.4)
Cholesterol, Total: 176 mg/dL (ref 100–199)
HDL: 67 mg/dL (ref >39)
LDL Chol Calc (NIH): 92 mg/dL (ref 0–99)
Triglycerides: 94 mg/dL (ref 0–149)
VLDL Cholesterol Cal: 17 mg/dL (ref 5–40)

## 2019-09-01 LAB — T4, FREE: Free T4: 1.44 ng/dL (ref 0.82–1.77)

## 2019-09-01 LAB — TSH: TSH: 11.1 u[IU]/mL — ABNORMAL HIGH (ref 0.450–4.500)

## 2019-09-01 MED ORDER — NONFORMULARY OR COMPOUNDED ITEM: 5 refills | Status: DC

## 2019-09-01 NOTE — Telephone Encounter (Signed)
-----   Message from Birdie Sons, MD sent at 09/01/2019 11:12 AM EDT ----- She is somewhat hypoThyroid, recommend she increase VoThy (T4) to 88 mcg, one capsule daily. #30 rf x 5. This is a compounded medications that needs to be called into [facility]. Rest of labs are normal please advise patient. And call in medication

## 2019-09-01 NOTE — Telephone Encounter (Signed)
Patient advised and agrees with treatment plan. Prescription called into [facility] pharmacy.

## 2019-09-22 ENCOUNTER — Ambulatory Visit: Payer: BC Managed Care – PPO | Admitting: Physician Assistant

## 2019-09-22 ENCOUNTER — Encounter: Payer: Self-pay | Admitting: Physician Assistant

## 2019-09-22 ENCOUNTER — Other Ambulatory Visit: Payer: Self-pay

## 2019-09-22 VITALS — BP 125/85 | HR 90 | Temp 96.8°F | Wt 120.2 lb

## 2019-09-22 DIAGNOSIS — N3001 Acute cystitis with hematuria: Secondary | ICD-10-CM

## 2019-09-22 DIAGNOSIS — R300 Dysuria: Secondary | ICD-10-CM | POA: Diagnosis not present

## 2019-09-22 LAB — POCT URINALYSIS DIPSTICK
Bilirubin, UA: NEGATIVE
Glucose, UA: NEGATIVE
Ketones, UA: NEGATIVE
Nitrite, UA: NEGATIVE
Protein, UA: NEGATIVE
Spec Grav, UA: <=1.005 — AB (ref 1.010–1.025)
Urobilinogen, UA: 0.2 E.U./dL
pH, UA: 7 (ref 5.0–8.0)

## 2019-09-22 MED ORDER — DOXYCYCLINE HYCLATE 100 MG PO TABS: 100.0000 mg | ORAL_TABLET | Freq: Two times a day (BID) | ORAL | 0 refills | Status: AC

## 2019-09-22 MED ORDER — DOXYCYCLINE HYCLATE 100 MG PO TABS: 100.0000 mg | ORAL_TABLET | Freq: Two times a day (BID) | ORAL | 0 refills | Status: DC

## 2019-09-22 NOTE — Progress Notes (Signed)
Established patient visit   Patient: Dawn [name]   DOB: Feb 08, 1966   54 y.o. Female  MRN: [id number] Visit Date: 09/22/2019  Today's healthcare provider: Trinna Post, PA-C   Chief Complaint  Patient presents with  . Urinary Tract Infection   Mertie Moores as a scribe for Trinna Post, PA-C.,have documented all relevant documentation on the behalf of Trinna Post, PA-C,as directed by  Trinna Post, PA-C while in the presence of Trinna Post, PA-C.  Subjective    Urinary Tract Infection  This is a recurrent problem. The current episode started yesterday. The problem occurs every urination. The problem has been gradually worsening. The quality of the pain is described as burning. The pain is mild. There has been no fever. Associated symptoms include frequency and urgency. Pertinent negatives include no hematuria or nausea. She has tried increased fluids (On 07/29/19 was treated with Cipro then switched to Doxy) for the symptoms. The treatment provided mild relief.        Medications: Outpatient Medications Prior to Visit  Medication Sig  . albuterol (VENTOLIN HFA) 108 (90 Base) MCG/ACT inhaler INHALE 2 PUFFS INTO THE LUNGS EVERY 6 HOURS AS NEEDED FOR WHEEZING OR SHORTNESS OF BREATH  . alendronate (FOSAMAX) 70 MG tablet Take 1 tablet (70 mg total) by mouth once a week. Take with a full glass of water on an empty stomach.  . budesonide-formoterol (SYMBICORT) 160-4.5 MCG/ACT inhaler Inhale 2 puffs into the lungs 2 (two) times daily.  Marland Kitchen CALCIUM-MAGNESIUM-VITAMIN D PO Take 1 capsule by mouth 2 (two) times daily.    Marland Kitchen Coral Calcium-Magnesium-Vit D 200-100-100 MG-MG-UNIT CAPS Take 2 each by mouth 3 (three) times daily.  Marland Kitchen EPINEPHrine 0.3 mg/0.3 mL IJ SOAJ injection Inject 0.3 mLs (0.3 mg total) into the muscle as needed for anaphylaxis.  . Ferrous Sulfate (IRON SUPPLEMENT PO) Take 1 tablet by mouth daily.  Marland Kitchen glucose blood test strip   . loratadine  (CLARITIN) 10 MG tablet Take 10 mg by mouth 3 (three) times daily as needed for allergies.  Marland Kitchen LORazepam (ATIVAN) 0.5 MG tablet TAKE 1 TABLET BY MOUTH 1 TO 2 TIMES DAILY AS NEEDED FOR ANXIETY  . NONFORMULARY OR COMPOUNDED ITEM VoThy(T4) 36mg caps. One capsule daily  . predniSONE (DELTASONE) 5 MG tablet TAKE 1 TABLET(5 MG) BY MOUTH DAILY  . ranitidine (ZANTAC) 75 MG tablet Take 75 mg by mouth daily.   No facility-administered medications prior to visit.    Review of Systems  Constitutional: Negative.   Respiratory: Negative.   Cardiovascular: Negative.   Gastrointestinal: Negative for nausea.  Genitourinary: Positive for frequency and urgency. Negative for hematuria.  Musculoskeletal: Negative.       Objective    BP 125/85 (BP Location: Left Arm, Patient Position: Sitting, Cuff Size: Normal)   Pulse 90   Temp (!) 96.8 F (36 C) (Temporal)   Wt 120 lb 3.2 oz (54.5 kg)   BMI 20.00 kg/m    Physical Exam Constitutional:      Appearance: Normal appearance.  Cardiovascular:     Rate and Rhythm: Normal rate and regular rhythm.     Pulses: Normal pulses.     Heart sounds: Normal heart sounds.  Pulmonary:     Effort: Pulmonary effort is normal.     Breath sounds: Normal breath sounds.  Abdominal:     General: Bowel sounds are normal.     Palpations: Abdomen is soft.  Skin:    General: Skin is  warm and dry.  Neurological:     Mental Status: She is alert and oriented to person, place, and time. Mental status is at baseline.  Psychiatric:        Mood and Affect: Mood normal.        Behavior: Behavior normal.       Results for orders placed or performed in visit on 09/22/19  POCT Urinalysis Dipstick  Result Value Ref Range   Color, UA pale yellow    Clarity, UA clear    Glucose, UA Negative Negative   Bilirubin, UA negative    Ketones, UA negative    Spec Grav, UA <=1.005 (A) 1.010 - 1.025   Blood, UA non hemolyzed trace    pH, UA 7.0 5.0 - 8.0   Protein, UA  Negative Negative   Urobilinogen, UA 0.2 0.2 or 1.0 E.U./dL   Nitrite, UA negative    Leukocytes, UA Moderate (2+) (A) Negative   Appearance     Odor      Assessment & Plan    1. Dysuria  - POCT Urinalysis Dipstick  2. Acute cystitis with hematuria Recurrent. Will try Doxy again. Will switch if not sensitive. Culture should be back in a couple days.  - Urine Culture - doxycycline (VIBRA-TABS) 100 MG tablet; Take 1 tablet (100 mg total) by mouth 2 (two) times daily.  Dispense: 14 tablet; Refill: 0   Return if symptoms worsen or fail to improve.      ITrinna Post, PA-C, have reviewed all documentation for this visit. The documentation on 09/22/19 for the exam, diagnosis, procedures, and orders are all accurate and complete.    Paulene Floor  [facility] [phone] (phone) [phone] (fax)  [place]

## 2019-09-24 LAB — URINE CULTURE

## 2019-12-02 ENCOUNTER — Telehealth: Payer: Self-pay

## 2019-12-02 NOTE — Telephone Encounter (Signed)
Copied from [place] [id number]. Topic: General - Other >> Dec 02, 2019 10:34 AM Celene Kras wrote: Reason for CRM: Pt called stating that she is needing a letter for her employer stating that she has an egg allergy and that she is not able to receive the regular flu vaccine. Please advise.

## 2019-12-05 NOTE — Telephone Encounter (Signed)
Copied from [place] [phone]. Topic: General - Other >> Dec 02, 2019 10:34 AM Celene Kras wrote: Reason for CRM: Pt called stating that she is needing a letter for her employer stating that she has an egg allergy and that she is not able to receive the regular flu vaccine. Please advise. >> Dec 05, 2019  9:27 AM Leward Quan A wrote: Patient called to request the letter from Dr Caryn Section again that states that she cant have the flu shot due to her egg allergy. Please advise  Asking for this to be sent to her via My Chart.

## 2019-12-05 NOTE — Progress Notes (Signed)
I,Roshena L Chambers,acting as a scribe for Lelon Huh, MD.,have documented all relevant documentation on the behalf of Lelon Huh, MD,as directed by  Lelon Huh, MD while in the presence of Lelon Huh, MD.  Established patient visit   Patient: Dawn [name]   DOB: [birth year]   54 y.o. Female  MRN: [id number] Visit Date: 12/06/2019  Today's healthcare provider: Lelon Huh, MD   Chief Complaint  Patient presents with  . Follow-up   Subjective    HPI  Patient is here requesting a note to exempt her from getting the flu vaccine. She states she has an egg allergy. Patient works for [employer]. They require employees to get a annual flu vaccine. 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 Halford Chessman. She has never had a flu vaccine in the past. She did get the [place] and [place] covid vaccine recently and reports that within and hour she develop  severe hypotension, light headedness, respiratory distress and numbness and tingling around her lips.      Medications: Outpatient Medications Prior to Visit  Medication Sig  . albuterol (VENTOLIN HFA) 108 (90 Base) MCG/ACT inhaler INHALE 2 PUFFS INTO THE LUNGS EVERY 6 HOURS AS NEEDED FOR WHEEZING OR SHORTNESS OF BREATH  . alendronate (FOSAMAX) 70 MG tablet Take 1 tablet (70 mg total) by mouth once a week. Take with a full glass of water on an empty stomach.  . budesonide-formoterol (SYMBICORT) 160-4.5 MCG/ACT inhaler Inhale 2 puffs into the lungs 2 (two) times daily.  Marland Kitchen CALCIUM-MAGNESIUM-VITAMIN D PO Take 1 capsule by mouth 2 (two) times daily.    Marland Kitchen Coral Calcium-Magnesium-Vit D 200-100-100 MG-MG-UNIT CAPS Take 2 each by mouth 3 (three) times daily.  Marland Kitchen EPINEPHrine 0.3 mg/0.3 mL IJ SOAJ injection Inject 0.3 mLs (0.3 mg total) into the muscle as needed for anaphylaxis.  . Ferrous Sulfate (IRON SUPPLEMENT PO) Take 1 tablet by mouth daily.  Marland Kitchen glucose blood test strip   . loratadine (CLARITIN) 10  MG tablet Take 10 mg by mouth 3 (three) times daily as needed for allergies.  Marland Kitchen LORazepam (ATIVAN) 0.5 MG tablet TAKE 1 TABLET BY MOUTH 1 TO 2 TIMES DAILY AS NEEDED FOR ANXIETY  . NONFORMULARY OR COMPOUNDED ITEM VoThy(T4) 49mg caps. One capsule daily  . predniSONE (DELTASONE) 5 MG tablet TAKE 1 TABLET(5 MG) BY MOUTH DAILY  . ranitidine (ZANTAC) 75 MG tablet Take 75 mg by mouth daily.   No facility-administered medications prior to visit.       Objective    BP 118/86 (BP Location: Left Arm, Patient Position: Sitting, Cuff Size: Normal)   Pulse 81   Temp 97.6 F (36.4 C) (Oral)   Resp 16   Wt 120 lb (54.4 kg)   BMI 19.97 kg/m    Physical Exam  General appearance: Thin female, cooperative and in no acute distress Head: Normocephalic, without obvious abnormality, atraumatic Respiratory: Respirations even and unlabored, normal respiratory rate Extremities: All extremities are intact.  Skin: Skin color, texture, turgor normal. No rashes seen  Psych: Appropriate mood and affect. Neurologic: Mental status: Alert, oriented to person, place, and time, thought content appropriate.   No results found for any visits on 12/06/19.  Assessment & Plan     1. Mast cell activation syndrome (HCC) Considering anaphylactic like reactions to other vaccines and multiple medication allergies, she is at increased risk for several reaction to the flu vaccine. Completed flu vaccine exemption form for [facility].   2.  Colon cancer screening She has reported history of colon polyps. Last colonoscopy was by Dr. Allen Norris in 2016 and was recommended to repeat in 5 years.  - Ambulatory referral to Gastroenterology  3. History of colonic polyps  30 minutes were spent discussing various allergic reactions and reviewing her risk of serious reaction to the flu vaccine, completed influenza exemption forms, and to review her medical records regarding her previously colonoscopy results.   No follow-ups on  file.      The entirety of the information documented in the History of Present Illness, Review of Systems and Physical Exam were personally obtained by me. Portions of this information were initially documented by the CMA and reviewed by me for thoroughness and accuracy.      Lelon Huh, MD  [facility] [phone] (phone) [phone] (fax)  [place]

## 2019-12-06 ENCOUNTER — Encounter: Payer: Self-pay | Admitting: Family Medicine

## 2019-12-06 ENCOUNTER — Other Ambulatory Visit: Payer: Self-pay

## 2019-12-06 ENCOUNTER — Ambulatory Visit: Payer: BC Managed Care – PPO | Admitting: Family Medicine

## 2019-12-06 VITALS — BP 118/86 | HR 81 | Temp 97.6°F | Resp 16 | Wt 120.0 lb

## 2019-12-06 DIAGNOSIS — Z1211 Encounter for screening for malignant neoplasm of colon: Secondary | ICD-10-CM | POA: Diagnosis not present

## 2019-12-06 DIAGNOSIS — Z86010 Personal history of colonic polyps: Secondary | ICD-10-CM

## 2019-12-06 DIAGNOSIS — D8940 Mast cell activation, unspecified: Secondary | ICD-10-CM

## 2019-12-07 NOTE — Telephone Encounter (Signed)
Office visit 12-06-2019

## 2019-12-16 ENCOUNTER — Encounter: Payer: Self-pay | Admitting: Family Medicine

## 2019-12-16 ENCOUNTER — Telehealth: Payer: Self-pay

## 2019-12-16 NOTE — Telephone Encounter (Signed)
Copied from [place] [id number]. Topic: General - Other >> Dec 16, 2019  1:35 PM Keene Breath wrote: Reason for CRM: Patient would like the nurse or doctor to call her regarding the message she sent to the Dr. In My Chart.  She stated that her employer is not accepting the paperwork she handed in and needs some additional information from the doctor.  Please call to discuss at [phone]

## 2019-12-19 NOTE — Telephone Encounter (Signed)
Spoke with patient see my chart message sent 12/16/19. KW

## 2019-12-21 ENCOUNTER — Ambulatory Visit: Payer: BC Managed Care – PPO | Admitting: Pulmonary Disease

## 2019-12-21 NOTE — Telephone Encounter (Signed)
Has this message been completed/ resolved? Its still open in the nurse box.

## 2020-01-26 ENCOUNTER — Other Ambulatory Visit: Payer: Self-pay | Admitting: Family Medicine

## 2020-01-26 DIAGNOSIS — F418 Other specified anxiety disorders: Secondary | ICD-10-CM

## 2020-01-26 NOTE — Telephone Encounter (Signed)
Requested medication (s) are due for refill today: yes  Requested medication (s) are on the active medication list:yes  Last refill: 08/31/19  #60 3 refills  Future visit scheduled: No  Notes to clinic: Not delegated    Requested Prescriptions  Pending Prescriptions Disp Refills   LORazepam (ATIVAN) 0.5 MG tablet [Pharmacy Med Name: LORAZEPAM 0.5MG TABLETS] 60 tablet     Sig: TAKE 1 TABLET BY MOUTH 1 TO 2 TIMES DAILY AS NEEDED FOR ANXIETY      Not Delegated - Psychiatry:  Anxiolytics/Hypnotics Failed - 01/26/2020 11:55 AM      Failed - This refill cannot be delegated      Failed - Urine Drug Screen completed in last 360 days.      Passed - Valid encounter within last 6 months    Recent Outpatient Visits           1 month ago Mast cell activation syndrome [facility])   [facility] Birdie Sons, MD   4 months ago Acute cystitis with hematuria   [facility] Carles Collet M, PA-C   4 months ago Situational anxiety   [facility] Birdie Sons, MD   6 months ago [place] Trinna Post, [place]   1 year ago Acquired hypothyroidism   [place], Kirstie Peri, MD       Future Appointments             In 3 weeks Chesley Mires, MD [facility]

## 2020-02-21 ENCOUNTER — Ambulatory Visit: Payer: BC Managed Care – PPO | Admitting: Pulmonary Disease

## 2020-04-01 ENCOUNTER — Other Ambulatory Visit: Payer: Self-pay | Admitting: Family Medicine

## 2020-04-01 DIAGNOSIS — K50814 Crohn's disease of both small and large intestine with abscess: Secondary | ICD-10-CM

## 2020-04-01 DIAGNOSIS — J45909 Unspecified asthma, uncomplicated: Secondary | ICD-10-CM

## 2020-04-01 NOTE — Telephone Encounter (Signed)
Requested medication (s) are due for refill today: yes  Requested medication (s) are on the active medication list: yes  Last refill:  08/31/19  Future visit scheduled: no  Notes to clinic:  med not delegated to NT to RF   Requested Prescriptions  Pending Prescriptions Disp Refills   predniSONE (DELTASONE) 5 MG tablet [Pharmacy Med Name: PREDNISONE 5MG TABLETS] 30 tablet 5    Sig: TAKE 1 TABLET(5 MG) BY MOUTH DAILY      Not Delegated - Endocrinology:  Oral Corticosteroids Failed - 04/01/2020 11:32 AM      Failed - This refill cannot be delegated      Passed - Last BP in normal range    BP Readings from Last 1 Encounters:  12/06/19 118/86          Passed - Valid encounter within last 6 months    Recent Outpatient Visits           3 months ago Mast cell activation syndrome [facility])   [facility] Birdie Sons, MD   6 months ago Acute cystitis with hematuria   [facility] [place], Adriana M, PA-C   7 months ago Situational anxiety   [facility] Birdie Sons, MD   8 months ago [place], [place], [place]   2 years ago Acquired hypothyroidism   [place], Kirstie Peri, MD       Future Appointments             In 1 week Chesley Mires, MD [facility]

## 2020-04-11 ENCOUNTER — Other Ambulatory Visit: Payer: Self-pay | Admitting: Family Medicine

## 2020-04-11 DIAGNOSIS — E039 Hypothyroidism, unspecified: Secondary | ICD-10-CM

## 2020-04-11 MED ORDER — NONFORMULARY OR COMPOUNDED ITEM: 5 refills | Status: DC

## 2020-04-13 ENCOUNTER — Ambulatory Visit: Payer: BC Managed Care – PPO | Admitting: Pulmonary Disease

## 2020-06-01 ENCOUNTER — Other Ambulatory Visit: Payer: Self-pay | Admitting: Family Medicine

## 2020-06-01 DIAGNOSIS — F418 Other specified anxiety disorders: Secondary | ICD-10-CM

## 2020-06-01 NOTE — Telephone Encounter (Signed)
Requested medication (s) are due for refill today: yes  Requested medication (s) are on the active medication list: yes  Last refill:  05/02/2020  Future visit scheduled: no  Notes to clinic: this refill cannot be delegated    Requested Prescriptions  Pending Prescriptions Disp Refills   LORazepam (ATIVAN) 0.5 MG tablet [Pharmacy Med Name: LORAZEPAM 0.5MG TABLETS] 60 tablet     Sig: TAKE 1 TABLET BY MOUTH 1 TO 2 TIMES DAILY AS NEEDED FOR ANXIETY      Not Delegated - Psychiatry:  Anxiolytics/Hypnotics Failed - 06/01/2020  8:20 AM      Failed - This refill cannot be delegated      Failed - Urine Drug Screen completed in last 360 days      Passed - Valid encounter within last 6 months    Recent Outpatient Visits           5 months ago Mast cell activation syndrome [facility])   [facility] Birdie Sons, MD   8 months ago Acute cystitis with hematuria   [facility] Carles Collet M, [place]   9 months ago Situational anxiety   [facility] Birdie Sons, MD   10 months ago [place], [place], [place]   2 years ago Acquired hypothyroidism   [place], Kirstie Peri, MD       Future Appointments             In 3 weeks Chesley Mires, MD [facility]

## 2020-06-12 ENCOUNTER — Other Ambulatory Visit: Payer: Self-pay | Admitting: Family Medicine

## 2020-06-12 NOTE — Telephone Encounter (Signed)
[place] faxed refill request for the following medications:  Coral Calcium-Magnesium-Vit D 200-100-100 MG-MG-UNIT CAPS  Last Rx: 04/26/2019 LOV: 810/21 Please advise. Thanks TNP

## 2020-06-13 MED ORDER — CORAL CALCIUM-MAGNESIUM-VIT D 200-100-100 MG-MG-UNIT PO CAPS: 2.0000 | ORAL_CAPSULE | Freq: Three times a day (TID) | ORAL | 3 refills | Status: DC

## 2020-06-22 ENCOUNTER — Other Ambulatory Visit: Payer: Self-pay

## 2020-06-22 ENCOUNTER — Ambulatory Visit: Payer: BC Managed Care – PPO | Admitting: Pulmonary Disease

## 2020-06-22 ENCOUNTER — Encounter: Payer: Self-pay | Admitting: Pulmonary Disease

## 2020-06-22 VITALS — BP 120/78 | HR 91 | Temp 97.3°F | Ht 65.0 in | Wt 126.0 lb

## 2020-06-22 DIAGNOSIS — D8940 Mast cell activation, unspecified: Secondary | ICD-10-CM

## 2020-06-22 DIAGNOSIS — J45909 Unspecified asthma, uncomplicated: Secondary | ICD-10-CM | POA: Diagnosis not present

## 2020-06-22 DIAGNOSIS — R911 Solitary pulmonary nodule: Secondary | ICD-10-CM | POA: Diagnosis not present

## 2020-06-22 NOTE — Progress Notes (Signed)
[facility]  Chief Complaint  Patient presents with  . Follow-up    Sob sometimes. Seeing no difference with Singulair and Symbicort. Still using prednisone and albuterol that is working well.     Constitutional:  BP 120/78 (BP Location: Right Arm, Cuff Size: Normal)   Pulse 91   Temp (!) 97.3 F (36.3 C)   Ht 5' 5"  (1.651 m)   Wt 126 lb (57.2 kg)   SpO2 98%   BMI 20.97 kg/m   Past Medical History:  Rheumatoid arthritis, Psoriasis, Sjogren's disease, Ulcerative colitis, Autoimmune thyroiditis, Mast cell activation syndrome, Anxiety, OA, Osteoporosis, Raynaud phenomenon  Past Surgical History:  She  has a past surgical history that includes Shoulder surgery and Electromyography (06/13/15).  Brief Summary:  Dawn [name] is a 55 y.o. female former smoker with allergic asthma and mast cell disorder.      Subjective:   I last saw her in 2019.  She was seen in September 2021 at [facility] by Dr. Johnney Killian.  She is getting urine study set up.  Hasn't started cromolyn yet.  Most of her symptoms from Mast cell disorder are GI related.  Had respiratory symptoms several months ago and improved with steroid taper.  Not currently having cough, wheeze, chest congestion, or phlegm.  Physical Exam:   Appearance - well kempt   ENMT - no sinus tenderness, no oral exudate, no LAN, Mallampati 2 airway, no stridor  Respiratory - equal breath sounds bilaterally, no wheezing or rales  CV - s1s2 regular rate and rhythm, no murmurs  Ext - no clubbing, no edema  Skin - no rashes  Psych - normal mood and affect   Pulmonary testing:   RAST 02/23/14 >> positive  RAST 08/17/17 >> dust mites, mold  PFT 10/12/17 >> FEV1 1.81 (64%), FEV1% 68, unable to do lung volumes, DLCO 91%, +BD  Chest Imaging:   CT chest 07/11/15 >> minimal CAD, upper lobe patchy opacities Rt > Lt, pectus excavatum  CT chest 09/29/17 >> GGO RUL no change, 6 mm  nodule LUL  Social History:  She  reports that she quit smoking about 25 years ago. Her smoking use included cigarettes. She has a 3.00 pack-year smoking history. She has never used smokeless tobacco. She reports current alcohol use. She reports that she does not use drugs.  Family History:  Her family history includes Cancer in her mother; Colon cancer in her paternal grandmother; Heart disease in her father; Irritable bowel syndrome in her mother; Prostate cancer in her father; Renal cancer in her maternal grandmother.    Discussion:  She has hx of allergic asthma and rhinitis.  She had CT chest in 2017 that showed upper lobe opacities and pectus excavatum.  She has hx of psoriatic arthritis, Sjogren's, and inflammatory bowel disease.  She is on chronic prednisone for IBD.  She was intolerant of MTX previously.  She reports has mast activation syndrome.  Assessment/Plan:   Allergic asthma and rhinitis. - continue symbicort, claritin - prn albuterol  Mast cell activation syndrome. - followed by Dr. Trixie Rude Lugar with [facility] allergy/immunology  Lung nodule. - will arrange for follow up CT chest w/o contrast and call her with results  Hx of psoriatic arthritis, Sjogren's, IBD. - she is maintained on prednisone - hasn't been seen by GI or rheumatology recently  Time Spent Involved in Patient Care on Day of Examination:  32 minutes  Follow up:  Patient Instructions  Will schedule [facility] chest at [facility]  Follow up in 3 months in [place] office   Medication List:   Allergies as of 06/22/2020      Reactions   Codeine    Erythromycin    Other    ANTICHOLINERGIC MEDICATIONS    Oxycodone Other (See Comments)   Oxycodone-acetaminophen Other (See Comments), Nausea And Vomiting   Penicillins    Sugar-protein-starch    Sulfa Drugs Cross Reactors    Vancomycin       Medication List       Accurate as of June 22, 2020 10:23 AM. If you have any questions, ask your nurse  or doctor.        albuterol 108 (90 Base) MCG/ACT inhaler Commonly known as: VENTOLIN HFA INHALE 2 PUFFS INTO THE LUNGS EVERY 6 HOURS AS NEEDED FOR WHEEZING OR SHORTNESS OF BREATH   alendronate 70 MG tablet Commonly known as: FOSAMAX Take 1 tablet (70 mg total) by mouth once a week. Take with a full glass of water on an empty stomach.   budesonide-formoterol 160-4.5 MCG/ACT inhaler Commonly known as: SYMBICORT Inhale 2 puffs into the lungs 2 (two) times daily.   CALCIUM-MAGNESIUM-VITAMIN D PO Take 1 capsule by mouth 2 (two) times daily.   Coral Calcium-Magnesium-Vit D 200-100-100 MG-MG-UNIT Caps Take 2 each by mouth 3 (three) times daily.   cromolyn 100 MG/5ML solution Commonly known as: GASTROCROM Take by mouth.   EPINEPHrine 0.3 mg/0.3 mL Soaj injection Commonly known as: EPI-PEN Inject 0.3 mLs (0.3 mg total) into the muscle as needed for anaphylaxis.   glucose blood test strip   IRON SUPPLEMENT PO Take 1 tablet by mouth daily.   loratadine 10 MG tablet Commonly known as: CLARITIN Take 10 mg by mouth 3 (three) times daily as needed for allergies.   LORazepam 0.5 MG tablet Commonly known as: ATIVAN TAKE 1 TABLET BY MOUTH 1 TO 2 TIMES DAILY AS NEEDED FOR ANXIETY   NONFORMULARY OR COMPOUNDED ITEM VoThy(T4) 93mg caps. One capsule daily   predniSONE 5 MG tablet Commonly known as: DELTASONE TAKE 1 TABLET(5 MG) BY MOUTH DAILY   ranitidine 75 MG tablet Commonly known as: ZANTAC Take 75 mg by mouth daily.       Signature:  VChesley Mires MD [place] GreenPager - ([phone]/25/2022, 10:23 AM

## 2020-06-22 NOTE — Patient Instructions (Signed)
Will schedule CT chest at [facility]  Follow up in 3 months in [place] office

## 2020-07-03 ENCOUNTER — Ambulatory Visit: Payer: BC Managed Care – PPO

## 2020-07-11 ENCOUNTER — Ambulatory Visit: Payer: BC Managed Care – PPO

## 2020-07-20 ENCOUNTER — Telehealth: Payer: BC Managed Care – PPO | Admitting: Adult Health

## 2020-07-20 ENCOUNTER — Encounter: Payer: Self-pay | Admitting: Adult Health

## 2020-07-20 VITALS — Temp 101.5°F

## 2020-07-20 DIAGNOSIS — R509 Fever, unspecified: Secondary | ICD-10-CM

## 2020-07-20 DIAGNOSIS — R059 Cough, unspecified: Secondary | ICD-10-CM

## 2020-07-20 DIAGNOSIS — R5383 Other fatigue: Secondary | ICD-10-CM

## 2020-07-20 DIAGNOSIS — J189 Pneumonia, unspecified organism: Secondary | ICD-10-CM

## 2020-07-20 DIAGNOSIS — U071 COVID-19: Secondary | ICD-10-CM

## 2020-07-20 MED ORDER — PREDNISONE 10 MG (21) PO TBPK: ORAL_TABLET | ORAL | 0 refills | Status: DC

## 2020-07-20 MED ORDER — CLINDAMYCIN HCL 300 MG PO CAPS: 300.0000 mg | ORAL_CAPSULE | Freq: Three times a day (TID) | ORAL | 0 refills | Status: DC

## 2020-07-20 MED ORDER — CLINDAMYCIN HCL 300 MG PO CAPS: 300.0000 mg | ORAL_CAPSULE | Freq: Three times a day (TID) | ORAL | 0 refills | Status: AC

## 2020-07-20 NOTE — Patient Instructions (Signed)
I discussed the limitations of evaluation and management by telemedicine and the availability of in person appointments. The patient expressed understanding and agreed to proceed. Seek in person care immediately if any symptoms worsen, persist or change at any time.   Prednisolone tablets What is this medicine? PREDNISOLONE (pred NISS oh lone) is a corticosteroid. It is commonly used to treat inflammation of the skin, joints, lungs, and other organs. Common conditions treated include asthma, allergies, and arthritis. It is also used for other conditions, such as blood disorders and diseases of the adrenal glands. This medicine may be used for other purposes; ask your health care provider or pharmacist if you have questions. COMMON BRAND NAME(S): Millipred, Millipred DP, Millipred DP 12-Day, Millipred DP 6 Day, Prednoral What should I tell my health care provider before I take this medicine? They need to know if you have any of these conditions:  Cushing's syndrome  diabetes  glaucoma  heart problems or disease  high blood pressure  infection such as herpes, measles, tuberculosis, or chickenpox  kidney disease  liver disease  mental problems  myasthenia gravis  osteoporosis  seizures  stomach ulcer or intestine disease including colitis and diverticulitis  thyroid problem  an unusual or allergic reaction to lactose, prednisolone, other medicines, foods, dyes, or preservatives  pregnant or trying to get pregnant  breast-feeding How should I use this medicine? Take this medicine by mouth with a glass of water. Follow the directions on the prescription label. Take it with food or milk to avoid stomach upset. If you are taking this medicine once a day, take it in the morning. Do not take more medicine than you are told to take. Do not suddenly stop taking your medicine because you may develop a severe reaction. Your doctor will tell you how much medicine to take. If your  doctor wants you to stop the medicine, the dose may be slowly lowered over time to avoid any side effects. Talk to your pediatrician regarding the use of this medicine in children. Special care may be needed. Overdosage: If you think you have taken too much of this medicine contact a poison control center or emergency room at once. NOTE: This medicine is only for you. Do not share this medicine with others. What if I miss a dose? If you miss a dose, take it as soon as you can. If it is almost time for your next dose, take only that dose. Do not take double or extra doses. What may interact with this medicine? Do not take this medicine with any of the following medications:  metyrapone  mifepristone This medicine may also interact with the following medications:  aminoglutethimide  amphotericin B  aspirin and aspirin-like medicines  barbiturates  certain medicines for diabetes, like glipizide or glyburide  cholestyramine  cholinesterase inhibitors  cyclosporine  digoxin  diuretics  ephedrine  female hormones, like estrogens and birth control pills  isoniazid  ketoconazole  NSAIDS, medicines for pain and inflammation, like ibuprofen or naproxen  phenytoin  rifampin  toxoids  vaccines  warfarin This list may not describe all possible interactions. Give your health care provider a list of all the medicines, herbs, non-prescription drugs, or dietary supplements you use. Also tell them if you smoke, drink alcohol, or use illegal drugs. Some items may interact with your medicine. What should I watch for while using this medicine? Visit your doctor or health care professional for regular checks on your progress. If you are taking this medicine over  a prolonged period, carry an identification card with your name and address, the type and dose of your medicine, and your doctor's name and address. This medicine may increase your risk of getting an infection. Tell your  doctor or health care professional if you are around anyone with measles or chickenpox, or if you develop sores or blisters that do not heal properly. If you are going to have surgery, tell your doctor or health care professional that you have taken this medicine within the last twelve months. Ask your doctor or health care professional about your diet. You may need to lower the amount of salt you eat. This medicine may increase blood sugar. Ask your healthcare provider if changes in diet or medicines are needed if you have diabetes. What side effects may I notice from receiving this medicine? Side effects that you should report to your doctor or health care professional as soon as possible:  allergic reactions like skin rash, itching or hives, swelling of the face, lips, or tongue  changes in emotions or moods  eye pain   signs and symptoms of high blood sugar such as being more thirsty or hungry or having to urinate more than normal. You may also feel very tired or have blurry vision.  signs and symptoms of infection like fever or chills; cough; sore throat; pain or trouble passing urine  slow growth in children (if used for longer periods of time)  swelling of ankles, feet  trouble sleeping  weak bones (if used for longer periods of time) Side effects that usually do not require medical attention (report to your doctor or health care professional if they continue or are bothersome):  nausea  skin problems, acne, thin and shiny skin  upset stomach  weight gain This list may not describe all possible side effects. Call your doctor for medical advice about side effects. You may report side effects to FDA at 1-800-FDA-1088. Where should I keep my medicine? Keep out of the reach of children. Store at room temperature between 15 and 30 degrees C (59 and 86 degrees F). Keep container tightly closed. Throw away any unused medicine after the expiration date. NOTE: This sheet is a  summary. It may not cover all possible information. If you have questions about this medicine, talk to your doctor, pharmacist, or health care provider.  2021 Elsevier/Gold Standard (2018-01-14 10:30:56) Clindamycin capsules What is this medicine? CLINDAMYCIN (Hiltonia sin) is a lincosamide antibiotic. It is used to treat certain kinds of bacterial infections. It will not work for colds, flu, or other viral infections. This medicine may be used for other purposes; ask your health care provider or pharmacist if you have questions. COMMON BRAND NAME(S): Cleocin What should I tell my health care provider before I take this medicine? They need to know if you have any of these conditions:  kidney disease  liver disease  stomach problems like colitis  an unusual or allergic reaction to clindamycin, lincomycin, or other medicines, foods, dyes like tartrazine or preservatives  pregnant or trying to get pregnant  breast-feeding How should I use this medicine? Take this medicine by mouth with a full glass of water. Follow the directions on the prescription label. You can take this medicine with food or on an empty stomach. If the medicine upsets your stomach, take it with food. Take your medicine at regular intervals. Do not take your medicine more often than directed. Take all of your medicine as directed even if you  think your are better. Do not skip doses or stop your medicine early. Talk to your pediatrician regarding the use of this medicine in children. Special care may be needed. Overdosage: If you think you have taken too much of this medicine contact a poison control center or emergency room at once. NOTE: This medicine is only for you. Do not share this medicine with others. What if I miss a dose? If you miss a dose, take it as soon as you can. If it is almost time for your next dose, take only that dose. Do not take double or extra doses. What may interact with this  medicine?  birth control pills  erythromycin  medicines that relax muscles for surgery  rifampin This list may not describe all possible interactions. Give your health care provider a list of all the medicines, herbs, non-prescription drugs, or dietary supplements you use. Also tell them if you smoke, drink alcohol, or use illegal drugs. Some items may interact with your medicine. What should I watch for while using this medicine? Tell your doctor or health care provider if your symptoms do not start to get better or if they get worse. This medicine may cause serious skin reactions. They can happen weeks to months after starting the medicine. Contact your health care provider right away if you notice fevers or flu-like symptoms with a rash. The rash may be red or purple and then turn into blisters or peeling of the skin. Or, you might notice a red rash with swelling of the face, lips or lymph nodes in your neck or under your arms. Do not treat diarrhea with over the counter products. Contact your doctor if you have diarrhea that lasts more than 2 days or if it is severe and watery. What side effects may I notice from receiving this medicine? Side effects that you should report to your doctor or health care professional as soon as possible:  allergic reactions like skin rash, itching or hives, swelling of the face, lips, or tongue  dark urine  pain on swallowing  rash, fever, and swollen lymph nodes  redness, blistering, peeling or loosening of the skin, including inside the mouth  unusual bleeding or bruising  unusually weak or tired  yellowing of eyes or skin Side effects that usually do not require medical attention (report to your doctor or health care professional if they continue or are bothersome):  diarrhea  itching in the rectal or genital area  joint pain  nausea, vomiting  stomach pain This list may not describe all possible side effects. Call your doctor for  medical advice about side effects. You may report side effects to FDA at 1-800-FDA-1088. Where should I keep my medicine? Keep out of the reach of children. Store at room temperature between 20 and 25 degrees C (68 and 77 degrees F). Throw away any unused medicine after the expiration date. NOTE: This sheet is a summary. It may not cover all possible information. If you have questions about this medicine, talk to your doctor, pharmacist, or health care provider.  2021 Elsevier/Gold Standard (2018-07-15 12:02:12) Cough, Adult A cough helps to clear your throat and lungs. A cough may be a sign of an illness or another medical condition. An acute cough may only last 2-3 weeks, while a chronic cough may last 8 or more weeks. Many things can cause a cough. They include:  Germs (viruses or bacteria) that attack the airway.  Breathing in things that bother (irritate) your  lungs.  Allergies.  Asthma.  Mucus that runs down the back of your throat (postnasal drip).  Smoking.  Acid backing up from the stomach into the tube that moves food from the mouth to the stomach (gastroesophageal reflux).  Some medicines.  Lung problems.  Other medical conditions, such as heart failure or a blood clot in the lung (pulmonary embolism). Follow these instructions at home: Medicines  Take over-the-counter and prescription medicines only as told by your doctor.  Talk with your doctor before you take medicines that stop a cough (cough suppressants). Lifestyle  Do not smoke, and try not to be around smoke. Do not use any products that contain nicotine or tobacco, such as cigarettes, e-cigarettes, and chewing tobacco. If you need help quitting, ask your doctor.  Drink enough fluid to keep your pee (urine) pale yellow.  Avoid caffeine.  Do not drink alcohol if your doctor tells you not to drink.   General instructions  Watch for any changes in your cough. Tell your doctor about them.  Always cover  your mouth when you cough.  Stay away from things that make you cough, such as perfume, candles, campfire smoke, or cleaning products.  If the air is dry, use a cool mist vaporizer or humidifier in your home.  If your cough is worse at night, try using extra pillows to raise your head up higher while you sleep.  Rest as needed.  Keep all follow-up visits as told by your doctor. This is important.   Contact a doctor if:  You have new symptoms.  You cough up pus.  Your cough does not get better after 2-3 weeks, or your cough gets worse.  Cough medicine does not help your cough and you are not sleeping well.  You have pain that gets worse or pain that is not helped with medicine.  You have a fever.  You are losing weight and you do not know why.  You have night sweats. Get help right away if:  You cough up blood.  You have trouble breathing.  Your heartbeat is very fast. These symptoms may be an emergency. Do not wait to see if the symptoms will go away. Get medical help right away. Call your local emergency services (911 in the [place] Do not drive yourself to the hospital. Summary  A cough helps to clear your throat and lungs. Many things can cause a cough.  Take over-the-counter and prescription medicines only as told by your doctor.  Always cover your mouth when you cough.  Contact a doctor if you have new symptoms or you have a cough that does not get better or gets worse. This information is not intended to replace advice given to you by your health care provider. Make sure you discuss any questions you have with your health care provider. Document Revised: 06/03/2019 Document Reviewed: 05/03/2018 Elsevier Patient Education  2021 [place] vs. Isolation QUARANTINE keeps someone who was in close contact with someone who has COVID-19 away from others. Quarantine if you have been in close contact with someone who has COVID-19, unless you have  been fully vaccinated. If you are fully vaccinated  You do NOT need to quarantine unless they have symptoms  Get tested 3-5 days after your exposure, even if you don't have symptoms  Wear a mask indoors in public for 14 days following exposure or until your test result is negative If you are not fully vaccinated  Stay home for 14 days  after your last contact with a person who has COVID-19  Watch for fever (100.47F), cough, shortness of breath, or other symptoms of COVID-19  If possible, stay away from people you live with, especially people who are at higher risk for getting very sick from COVID-19  Contact your local public health department for options in your area to possibly shorten your quarantine ISOLATION keeps someone who is sick or tested positive for COVID-19 without symptoms away from others, even in their own home. People who are in isolation should stay home and stay in a specific "sick room" or area and use a separate bathroom (if available). If you are sick and think or know you have COVID-19 Stay home until after  At least 10 days since symptoms first appeared and  At least 24 hours with no fever without the use of fever-reducing medications and  Symptoms have improved If you tested positive for COVID-19 but do not have symptoms  Stay home until after 10 days have passed since your positive viral test  If you develop symptoms after testing positive, follow the steps above for those who are sick [email or web address] 01/23/2020 This information is not intended to replace advice given to you by your health care provider. Make sure you discuss any questions you have with your health care provider. Document Revised: 02/27/2020 Document Reviewed: 02/27/2020 Elsevier Patient Education  2021 [employer].

## 2020-07-20 NOTE — Progress Notes (Signed)
MyChart Video Visit    Virtual Visit via Video Note   This visit type was conducted due to national recommendations for restrictions regarding the COVID-19 Pandemic (e.g. social distancing) in an effort to limit this patient's exposure and mitigate transmission in our community. This patient is at least at moderate risk for complications without adequate follow up. This format is felt to be most appropriate for this patient at this time. Physical exam was limited by quality of the video and audio technology used for the visit.  Parties involved in visit as below:   Patient location: at home  Provider location: Provider: Provider's office at  [facility], [place].     I discussed the limitations of evaluation and management by telemedicine and the availability of in person appointments. The patient expressed understanding and agreed to proceed.  Patient: Dawn [name]   DOB: 04-24-1966   55 y.o. Female  MRN: [id number] Visit Date: 07/20/2020  Today's healthcare provider: Marcille Buffy, FNP   Chief Complaint  Patient presents with  . Covid Positive   Subjective    HPI HPI    Patient presents in office today after testing positive at home for covid yesterday 07/19/20. Patient reports initial onset of symptoms started 3 days ago, she reports today fever ranging from 101.5-103.4 patietnt is taking Tylenol to control temperature. Patient states that she has symptoms today of fever, cough, and fatigue.    Last edited by Minette Headland, CMA on 07/20/2020  1:29 PM. (History)      Onset 07/18/20  100.5 temperature today.  Taking tylenol and motrin. Denies any distress when walking around.  Has some lightheaded and no syncope.  Husband positive with Covid.  She was vaccinated with Wynetta Emery and [place].  She has dry cough but now is productive with tan sputum/ yellow sputum.  History of asthma sees pulmonary denies any change from her chronic asthma,   Patient  denies any , rash, chest pain, shortness of breath, nausea, vomiting, or diarrhea.  She has maintenance dose of Prednisone.  Pulse oximetry is 98-99 yesterday and today above 94 %.    She is immunocompromised.  Patient  denies any, rash, chest pain, shortness of breath, nausea, vomiting, or diarrhea.   Denies dizziness, lightheadedness, pre syncopal or syncopal episodes.    Medications: Outpatient Medications Prior to Visit  Medication Sig  . albuterol (VENTOLIN HFA) 108 (90 Base) MCG/ACT inhaler INHALE 2 PUFFS INTO THE LUNGS EVERY 6 HOURS AS NEEDED FOR WHEEZING OR SHORTNESS OF BREATH  . alendronate (FOSAMAX) 70 MG tablet Take 1 tablet (70 mg total) by mouth once a week. Take with a full glass of water on an empty stomach.  . budesonide-formoterol (SYMBICORT) 160-4.5 MCG/ACT inhaler Inhale 2 puffs into the lungs 2 (two) times daily.  Marland Kitchen CALCIUM-MAGNESIUM-VITAMIN D PO Take 1 capsule by mouth 2 (two) times daily.  Marland Kitchen [place] Calcium-Magnesium-Vit D 200-100-100 MG-MG-UNIT CAPS Take 2 each by mouth 3 (three) times daily.  . cromolyn (GASTROCROM) 100 MG/5ML solution Take by mouth.  . EPINEPHrine 0.3 mg/0.3 mL IJ SOAJ injection Inject 0.3 mLs (0.3 mg total) into the muscle as needed for anaphylaxis.  . Ferrous Sulfate (IRON SUPPLEMENT PO) Take 1 tablet by mouth daily.  Marland Kitchen glucose blood test strip   . loratadine (CLARITIN) 10 MG tablet Take 10 mg by mouth 3 (three) times daily as needed for allergies.  Marland Kitchen LORazepam (ATIVAN) 0.5 MG tablet TAKE 1 TABLET BY MOUTH 1 TO 2  TIMES DAILY AS NEEDED FOR ANXIETY  . NONFORMULARY OR COMPOUNDED ITEM VoThy(T4) 64mg caps. One capsule daily  . predniSONE (DELTASONE) 5 MG tablet TAKE 1 TABLET(5 MG) BY MOUTH DAILY  . ranitidine (ZANTAC) 75 MG tablet Take 75 mg by mouth daily.   No facility-administered medications prior to visit.    Review of Systems  Constitutional: Positive for appetite change, chills, fatigue and fever.  HENT: Positive for postnasal  drip, rhinorrhea and sore throat. Negative for congestion, dental problem, drooling, ear discharge, ear pain, facial swelling, hearing loss, mouth sores, nosebleeds, sinus pressure, sinus pain, sneezing, tinnitus, trouble swallowing and voice change.   Eyes: Negative.   Respiratory: Positive for cough, shortness of breath, wheezing and stridor. Negative for apnea, choking and chest tightness.   Cardiovascular: Negative.   Gastrointestinal: Negative for abdominal distention, abdominal pain, anal bleeding, blood in stool, constipation, diarrhea, nausea, rectal pain and vomiting.  Genitourinary: Negative.   Musculoskeletal: Positive for arthralgias, myalgias, neck pain and neck stiffness.  Neurological: Positive for dizziness and headaches.       Objective    Temp (!) 101.5 F (38.6 C) (Oral)     Physical Exam   Patient is alert and oriented and responsive to questions Engages in conversation with provider. Speaks in full sentences without any pauses without any shortness of breath or distress.    Assessment & Plan     Pneumonia due to infectious organism, unspecified laterality, unspecified part of lung - Plan: DG Chest 2 View, CBC with Differential/Platelet, Comprehensive Metabolic Panel (CMET)  Cough - Plan: DG Chest 2 View, CBC with Differential/Platelet, Comprehensive Metabolic Panel (CMET)  Fatigue, unspecified type - Plan: DG Chest 2 View, CBC with Differential/Platelet, Comprehensive Metabolic Panel (CMET)  Fever, unspecified fever cause - Plan: DG Chest 2 View, CBC with Differential/Platelet, Comprehensive Metabolic Panel (CMET)  COVID - Plan: DG Chest 2 View, CBC with Differential/Platelet, Comprehensive Metabolic Panel (CMET)  . predniSONE (STERAPRED UNI-PAK 21 TAB) 10 MG (21) TBPK tablet    Sig: PO: Take 6 tablets on day 1:Take 5 tablets day 2:Take 4 tablets day 3: Take 3 tablets day 4:Take 2 tablets day five: 5 Take 1 tablet day 6    Dispense:  21 tablet    Refill:  0   . clindamycin (CLEOCIN) 300 MG capsule    Sig: Take 1 capsule (300 mg total) by mouth 3 (three) times daily for 10 days.    Dispense:  30 capsule    Refill:  0   Printed antibiotic script resent electronically.   Seek in person care immediately if any symptoms worsen, persist or change at anytime advised.   Return in about 3 years (around 07/21/2023), or if symptoms worsen or fail to improve, for at any time for any worsening symptoms, Go to Emergency room/ urgent care if worse.     I discussed the assessment and treatment plan with the patient. The patient was provided an opportunity to ask questions and all were answered. The patient agreed with the plan and demonstrated an understanding of the instructions.   The patient was advised to call back or seek an in-person evaluation if the symptoms worsen or if the condition fails to improve as anticipated.  I provided 30 minutes of non-face-to-face time during this encounter. The entirety of the information documented in the History of Present Illness, Review of Systems and Physical Exam were personally obtained by me. Portions of this information were initially documented by the [place] reviewed by  me for thoroughness and accuracy.   I discussed the limitations of evaluation and management by telemedicine and the availability of in person appointments. The patient expressed understanding and agreed to proceed.  Marcille Buffy, [place] [phone] (phone) [phone] (fax)  [place]

## 2020-08-06 ENCOUNTER — Other Ambulatory Visit: Payer: Self-pay | Admitting: Family Medicine

## 2020-08-06 DIAGNOSIS — J45909 Unspecified asthma, uncomplicated: Secondary | ICD-10-CM

## 2020-08-06 DIAGNOSIS — K50814 Crohn's disease of both small and large intestine with abscess: Secondary | ICD-10-CM

## 2020-08-06 NOTE — Telephone Encounter (Signed)
Requested medication (s) are due for refill today: yes  Requested medication (s) are on the active medication list: yes  Last refill:  04/02/20 #30 5 refills  Future visit scheduled: no , last seen 2 weeks ago  Notes to clinic:  not delegated per protocol     Requested Prescriptions  Pending Prescriptions Disp Refills   predniSONE (DELTASONE) 5 MG tablet [Pharmacy Med Name: PREDNISONE 5MG TABLETS] 30 tablet 5    Sig: TAKE 1 TABLET(5 MG) BY MOUTH DAILY      Not Delegated - Endocrinology:  Oral Corticosteroids Failed - 08/06/2020 12:07 PM      Failed - This refill cannot be delegated      Failed - Valid encounter within last 6 months    Recent Outpatient Visits           2 weeks ago Pneumonia due to infectious organism, unspecified laterality, unspecified part of lung   [facility], Kelby Aline, FNP   8 months ago Mast cell activation syndrome [facility])   [facility] [address] [facility] Birdie Sons, MD   10 months ago Acute cystitis with hematuria   [facility] Carles Collet M, [place]   11 months ago Situational anxiety   [facility] Birdie Sons, MD   1 year ago [place], [place]                Passed - Last BP in normal range    BP Readings from Last 1 Encounters:  06/22/20 120/78

## 2020-08-14 ENCOUNTER — Ambulatory Visit: Payer: BC Managed Care – PPO

## 2020-08-15 ENCOUNTER — Telehealth: Payer: Self-pay

## 2020-08-15 DIAGNOSIS — K51819 Other ulcerative colitis with unspecified complications: Secondary | ICD-10-CM

## 2020-08-15 DIAGNOSIS — Z86010 Personal history of colonic polyps: Secondary | ICD-10-CM

## 2020-08-15 NOTE — Addendum Note (Signed)
Addended by: Birdie Sons on: 08/15/2020 09:32 PM   Modules accepted: Orders

## 2020-08-15 NOTE — Telephone Encounter (Signed)
Copied from [place] [phone]. Topic: Referral - Request for Referral >> Aug 15, 2020  2:18 PM Lennox Solders wrote: Has patient seen PCP for this complaint? Yes. Referral for which specialty GI  Preferred provider/office: dr Allen Norris in [place]  [phone]. Pt called dr Allen Norris office and she needs another referral for colonoscopy due to IBD and colon polyps

## 2020-08-16 ENCOUNTER — Encounter: Payer: Self-pay | Admitting: *Deleted

## 2020-08-20 ENCOUNTER — Other Ambulatory Visit: Payer: Self-pay

## 2020-08-20 DIAGNOSIS — Z86010 Personal history of colonic polyps: Secondary | ICD-10-CM

## 2020-08-20 DIAGNOSIS — K51819 Other ulcerative colitis with unspecified complications: Secondary | ICD-10-CM

## 2020-08-20 MED ORDER — PEG 3350-KCL-NA BICARB-NACL 420 G PO SOLR: 4000.0000 mL | Freq: Once | ORAL | 0 refills | Status: AC

## 2020-08-21 ENCOUNTER — Telehealth (INDEPENDENT_AMBULATORY_CARE_PROVIDER_SITE_OTHER): Payer: Self-pay | Admitting: Gastroenterology

## 2020-08-21 ENCOUNTER — Other Ambulatory Visit: Payer: Self-pay

## 2020-08-21 DIAGNOSIS — Z86010 Personal history of colonic polyps: Secondary | ICD-10-CM

## 2020-08-21 DIAGNOSIS — K51819 Other ulcerative colitis with unspecified complications: Secondary | ICD-10-CM

## 2020-08-21 MED ORDER — PEG 3350-KCL-NA BICARB-NACL 420 G PO SOLR: 4000.0000 mL | Freq: Once | ORAL | 0 refills | Status: AC

## 2020-08-21 NOTE — Progress Notes (Signed)
Gastroenterology Pre-Procedure Review  Request Date: Monday 09/10/20 Requesting Physician: Dr. Allen Norris  PATIENT REVIEW QUESTIONS: The patient responded to the following health history questions as indicated:    1. Are you having any GI issues? no 2. Do you have a personal history of Polyps? yes (07/13/14 ) 3. Do you have a family history of Colon Cancer or Polyps? yes (paternal grandmother colon cancer) 4. Diabetes Mellitus? no 5. Joint replacements in the past 12 months?no 6. Major health problems in the past 3 months?no 7. Any artificial heart valves, MVP, or defibrillator?no    MEDICATIONS & ALLERGIES:    Patient reports the following regarding taking any anticoagulation/antiplatelet therapy:   Plavix, Coumadin, Eliquis, Xarelto, Lovenox, Pradaxa, Brilinta, or Effient? no Aspirin? no  Patient confirms/reports the following medications:  Current Outpatient Medications  Medication Sig Dispense Refill  . albuterol (VENTOLIN HFA) 108 (90 Base) MCG/ACT inhaler INHALE 2 PUFFS INTO THE LUNGS EVERY 6 HOURS AS NEEDED FOR WHEEZING OR SHORTNESS OF BREATH 8.5 g 5  . alendronate (FOSAMAX) 70 MG tablet Take 1 tablet (70 mg total) by mouth once a week. Take with a full glass of water on an empty stomach. 12 tablet 3  . budesonide-formoterol (SYMBICORT) 160-4.5 MCG/ACT inhaler Inhale 2 puffs into the lungs 2 (two) times daily. 1 Inhaler 6  . CALCIUM-MAGNESIUM-VITAMIN D PO Take 1 capsule by mouth 2 (two) times daily.    Marland Kitchen [place] Calcium-Magnesium-Vit D 200-100-100 MG-MG-UNIT CAPS Take 2 each by mouth 3 (three) times daily. 540 capsule 3  . cromolyn (GASTROCROM) 100 MG/5ML solution Take by mouth.    . EPINEPHrine 0.3 mg/0.3 mL IJ SOAJ injection Inject 0.3 mLs (0.3 mg total) into the muscle as needed for anaphylaxis. 2 each 4  . Ferrous Sulfate (IRON SUPPLEMENT PO) Take 1 tablet by mouth daily.    Marland Kitchen glucose blood test strip     . loratadine (CLARITIN) 10 MG tablet Take 10 mg by mouth 3 (three) times  daily as needed for allergies.    Marland Kitchen LORazepam (ATIVAN) 0.5 MG tablet TAKE 1 TABLET BY MOUTH 1 TO 2 TIMES DAILY AS NEEDED FOR ANXIETY 60 tablet 3  . NONFORMULARY OR COMPOUNDED ITEM VoThy(T4) 73mg caps. One capsule daily 30 each 5  . predniSONE (DELTASONE) 5 MG tablet TAKE 1 TABLET(5 MG) BY MOUTH DAILY 30 tablet 5  . predniSONE (STERAPRED UNI-PAK 21 TAB) 10 MG (21) TBPK tablet PO: Take 6 tablets on day 1:Take 5 tablets day 2:Take 4 tablets day 3: Take 3 tablets day 4:Take 2 tablets day five: 5 Take 1 tablet day 6 21 tablet 0  . ranitidine (ZANTAC) 75 MG tablet Take 75 mg by mouth daily.     No current facility-administered medications for this visit.    Patient confirms/reports the following allergies:  Allergies  Allergen Reactions  . Codeine   . Erythromycin   . Other     ANTICHOLINERGIC MEDICATIONS   . Oxycodone Other (See Comments)  . Oxycodone-Acetaminophen Other (See Comments) and Nausea And Vomiting  . Penicillins   . Sugar-Protein-Starch   . Sulfa Drugs Cross Reactors   . Vancomycin     Orders Placed This Encounter  Procedures  . Procedural/ Surgical Case Request: COLONOSCOPY WITH PROPOFOL    Standing Status:   Standing    Number of Occurrences:   1    Order Specific Question:   Pre-op diagnosis    Answer:   history of colon polyps, ulcerative colitis with complications    Order Specific  Question:   CPT Code    Answer:   16861    AUTHORIZATION INFORMATION Primary Insurance: 1D#: Group #:  Secondary Insurance: 1D#: Group #:  SCHEDULE INFORMATION: Date: 09/10/20 Time: Location:[facility]

## 2020-08-23 ENCOUNTER — Ambulatory Visit
Admission: RE | Admit: 2020-08-23 | Discharge: 2020-08-23 | Disposition: A | Discharge status: Home / Self Care | Payer: BC Managed Care – PPO | Source: Ambulatory Visit | Attending: Pulmonary Disease | Admitting: Pulmonary Disease

## 2020-08-23 ENCOUNTER — Telehealth: Payer: Self-pay | Admitting: Gastroenterology

## 2020-08-23 ENCOUNTER — Other Ambulatory Visit: Payer: Self-pay

## 2020-08-23 DIAGNOSIS — R911 Solitary pulmonary nodule: Secondary | ICD-10-CM | POA: Diagnosis not present

## 2020-08-23 NOTE — Progress Notes (Signed)
Called and left message on voicemail to please return phone call to go over CT results. Contact number provided.

## 2020-08-23 NOTE — Telephone Encounter (Signed)
Patient LVM to call back.  She picked up her prep and has questions .  Her procedure is 09/10/20

## 2020-08-24 NOTE — Telephone Encounter (Signed)
Returned pt's call and went over bowel prep instructions.

## 2020-08-27 ENCOUNTER — Telehealth: Payer: Self-pay | Admitting: Pulmonary Disease

## 2020-08-27 NOTE — Telephone Encounter (Signed)
Called and left message on voicemail to please return phone call. Contact number provided.

## 2020-08-28 ENCOUNTER — Other Ambulatory Visit: Payer: Self-pay | Admitting: Family Medicine

## 2020-08-28 DIAGNOSIS — M810 Age-related osteoporosis without current pathological fracture: Secondary | ICD-10-CM

## 2020-08-29 ENCOUNTER — Other Ambulatory Visit: Payer: Self-pay

## 2020-08-29 ENCOUNTER — Telehealth: Payer: Self-pay

## 2020-08-29 NOTE — Telephone Encounter (Signed)
Called and spoke to pt. Informed her of the results and recs per Dr. Halford Chessman. Appt scheduled with Dr. Halford Chessman in [place] for 11/08/20 (soonest available). Pt verbalized understanding and denied any further questions or concerns at this time.     CT chest results per Dr. Halford Chessman: Please let her know CT chest shows stable lung nodules.   Please schedule follow up with me or NP in [place] to discuss CT results in more detail.

## 2020-08-29 NOTE — Telephone Encounter (Signed)
Patient requested to have an unflavored bowel prep.  Her bowel prep that she picked up was flavored.  A new rx was called to pharmacy at [facility] for Golytely.  Nulytely was not available in unflavored therefore Nulytely was changed to Golytely.  Thanks   [place], [place]

## 2020-08-29 NOTE — Telephone Encounter (Signed)
duplicate

## 2020-08-29 NOTE — Telephone Encounter (Signed)
Pharmacy called wanting to speak with Sharyn Lull concerning medication for the patient. Says he just spoke with you and has a additional question.

## 2020-08-30 ENCOUNTER — Encounter: Payer: Self-pay | Admitting: Gastroenterology

## 2020-09-05 ENCOUNTER — Ambulatory Visit: Payer: BLUE CROSS/BLUE SHIELD | Admitting: Advanced Practice Midwife

## 2020-09-07 NOTE — Discharge Instructions (Signed)

## 2020-09-10 ENCOUNTER — Ambulatory Visit: Payer: BC Managed Care – PPO | Admitting: Anesthesiology

## 2020-09-10 ENCOUNTER — Encounter
Admission: RE | Disposition: A | Discharge status: Home / Self Care | Payer: Self-pay | Source: Home / Self Care | Attending: Gastroenterology

## 2020-09-10 ENCOUNTER — Ambulatory Visit
Admission: RE | Admit: 2020-09-10 | Discharge: 2020-09-10 | Disposition: A | Discharge status: Home / Self Care | Payer: BC Managed Care – PPO | Attending: Gastroenterology | Admitting: Gastroenterology

## 2020-09-10 ENCOUNTER — Encounter: Payer: Self-pay | Admitting: Gastroenterology

## 2020-09-10 ENCOUNTER — Other Ambulatory Visit: Payer: Self-pay

## 2020-09-10 DIAGNOSIS — Z8051 Family history of malignant neoplasm of kidney: Secondary | ICD-10-CM | POA: Insufficient documentation

## 2020-09-10 DIAGNOSIS — Z87891 Personal history of nicotine dependence: Secondary | ICD-10-CM | POA: Diagnosis not present

## 2020-09-10 DIAGNOSIS — K51819 Other ulcerative colitis with unspecified complications: Secondary | ICD-10-CM

## 2020-09-10 DIAGNOSIS — Z79899 Other long term (current) drug therapy: Secondary | ICD-10-CM | POA: Diagnosis not present

## 2020-09-10 DIAGNOSIS — Z800 Family history of malignant neoplasm of digestive organs: Secondary | ICD-10-CM | POA: Insufficient documentation

## 2020-09-10 DIAGNOSIS — Z882 Allergy status to sulfonamides status: Secondary | ICD-10-CM | POA: Diagnosis not present

## 2020-09-10 DIAGNOSIS — J45909 Unspecified asthma, uncomplicated: Secondary | ICD-10-CM | POA: Diagnosis not present

## 2020-09-10 DIAGNOSIS — Z881 Allergy status to other antibiotic agents status: Secondary | ICD-10-CM | POA: Insufficient documentation

## 2020-09-10 DIAGNOSIS — Z91018 Allergy to other foods: Secondary | ICD-10-CM | POA: Diagnosis not present

## 2020-09-10 DIAGNOSIS — K641 Second degree hemorrhoids: Secondary | ICD-10-CM | POA: Diagnosis not present

## 2020-09-10 DIAGNOSIS — Z86010 Personal history of colonic polyps: Secondary | ICD-10-CM

## 2020-09-10 DIAGNOSIS — Z8379 Family history of other diseases of the digestive system: Secondary | ICD-10-CM | POA: Insufficient documentation

## 2020-09-10 DIAGNOSIS — K5730 Diverticulosis of large intestine without perforation or abscess without bleeding: Secondary | ICD-10-CM | POA: Diagnosis not present

## 2020-09-10 DIAGNOSIS — Z888 Allergy status to other drugs, medicaments and biological substances status: Secondary | ICD-10-CM | POA: Insufficient documentation

## 2020-09-10 DIAGNOSIS — Z880 Allergy status to penicillin: Secondary | ICD-10-CM | POA: Diagnosis not present

## 2020-09-10 DIAGNOSIS — Z885 Allergy status to narcotic agent status: Secondary | ICD-10-CM | POA: Diagnosis not present

## 2020-09-10 DIAGNOSIS — Z1211 Encounter for screening for malignant neoplasm of colon: Secondary | ICD-10-CM | POA: Insufficient documentation

## 2020-09-10 DIAGNOSIS — K219 Gastro-esophageal reflux disease without esophagitis: Secondary | ICD-10-CM | POA: Insufficient documentation

## 2020-09-10 DIAGNOSIS — Z8042 Family history of malignant neoplasm of prostate: Secondary | ICD-10-CM | POA: Diagnosis not present

## 2020-09-10 DIAGNOSIS — Z8249 Family history of ischemic heart disease and other diseases of the circulatory system: Secondary | ICD-10-CM | POA: Diagnosis not present

## 2020-09-10 HISTORY — PX: COLONOSCOPY WITH PROPOFOL: SHX5780

## 2020-09-10 HISTORY — DX: Gastro-esophageal reflux disease without esophagitis: K21.9

## 2020-09-10 HISTORY — DX: Other mast cell neoplasms of uncertain behavior: D47.09

## 2020-09-10 SURGERY — COLONOSCOPY WITH PROPOFOL
Anesthesia: General | Site: Rectum

## 2020-09-10 MED ORDER — PROPOFOL 10 MG/ML IV BOLUS
INTRAVENOUS | Status: DC | PRN
  Administered 2020-09-10 (×2): 30 mg via INTRAVENOUS
  Administered 2020-09-10: 120 mg via INTRAVENOUS
  Administered 2020-09-10 (×3): 40 mg via INTRAVENOUS

## 2020-09-10 MED ORDER — SODIUM CHLORIDE 0.9 % IV SOLN: INTRAVENOUS | Status: DC

## 2020-09-10 MED ORDER — ONDANSETRON HCL 4 MG/2ML IJ SOLN: 4.0000 mg | Freq: Once | INTRAMUSCULAR | Status: DC | PRN

## 2020-09-10 MED ORDER — STERILE WATER FOR IRRIGATION IR SOLN: Status: DC | PRN

## 2020-09-10 MED ORDER — ACETAMINOPHEN 325 MG PO TABS: 325.0000 mg | ORAL_TABLET | ORAL | Status: DC | PRN

## 2020-09-10 MED ORDER — LIDOCAINE HCL (CARDIAC) PF 100 MG/5ML IV SOSY
PREFILLED_SYRINGE | INTRAVENOUS | Status: DC | PRN
  Administered 2020-09-10: 30 mg via INTRAVENOUS

## 2020-09-10 MED ORDER — LACTATED RINGERS IV SOLN: INTRAVENOUS | Status: DC

## 2020-09-10 MED ORDER — ACETAMINOPHEN 160 MG/5ML PO SOLN: 325.0000 mg | ORAL | Status: DC | PRN

## 2020-09-10 SURGICAL SUPPLY — 6 items
GOWN CVR UNV OPN BCK APRN NK (MISCELLANEOUS) ×2 IMPLANT
GOWN ISOL THUMB LOOP REG UNIV (MISCELLANEOUS) ×4
KIT PRC NS LF DISP ENDO (KITS) ×1 IMPLANT
KIT PROCEDURE OLYMPUS (KITS) ×2
MANIFOLD NEPTUNE II (INSTRUMENTS) ×2 IMPLANT
WATER STERILE IRR 250ML POUR (IV SOLUTION) ×2 IMPLANT

## 2020-09-10 NOTE — H&P (Signed)
Lucilla Lame, MD [address] [place], [place] [address] Phone:[phone] Fax : [phone]  Primary Care Physician:  Birdie Sons, MD Primary Gastroenterologist:  Dr. Allen Norris  Pre-Procedure History & Physical: HPI:  Dawn [name] is a 55 y.o. female is here for an colonoscopy.   Past Medical History:  Diagnosis Date  . Acquired hypothyroidism 12/11/2014  . Allergy to pollen 02/23/2006  . Anxiety   . Asthma   . Asthma, chronic 02/26/2014   Onset as teenager   . Asthma, exogenous 02/23/2006  . Crohn's disease of both small and large intestine with abscess ([place]) 04/28/2005   (Ulcerative Colitis)   . Fatigue 12/11/2014  . GERD (gastroesophageal reflux disease)   . Headache 12/11/2014  . History of anaphylaxis 12/11/2014  . History of insect sting allergy 02/26/2014   Hymenoptera, carries EpiPen   . Hx of colonic polyps   . Hx: UTI (urinary tract infection)   . Mast cell disease   . Osteoporosis due to androgen therapy 02/23/2006  . Raynaud's phenomenon 12/11/2014  . Rosacea 12/11/2014  . Tobacco abuse 04/29/2003  . Ulcerative colitis     Past Surgical History:  Procedure Laterality Date  . ELECTROMYOGRAPHY  06/13/15   UE EMG Normal  . SHOULDER SURGERY      Prior to Admission medications   Medication Sig Start Date End Date Taking? Authorizing Provider  albuterol (VENTOLIN HFA) 108 (90 Base) MCG/ACT inhaler INHALE 2 PUFFS INTO THE LUNGS EVERY 6 HOURS AS NEEDED FOR WHEEZING OR SHORTNESS OF BREATH 08/31/19  Yes Birdie Sons, MD  alendronate (FOSAMAX) 70 MG tablet TAKE 1 TABLET(70 MG) BY MOUTH 1 TIME A WEEK WITH A FULL GLASS OF WATER AND ON AN EMPTY STOMACH 08/28/20  Yes Birdie Sons, MD  budesonide-formoterol [facility]) 160-4.5 MCG/ACT inhaler Inhale 2 puffs into the lungs 2 (two) times daily. 08/17/17  Yes Chesley Mires, MD  CALCIUM-MAGNESIUM-VITAMIN D PO Take 1 capsule by mouth 2 (two) times daily.   Yes [provider]  cromolyn (GASTROCROM) 100  MG/5ML solution Take by mouth. 01/04/20 01/03/21 Yes [provider]  EPINEPHrine 0.3 mg/0.3 mL IJ SOAJ injection Inject 0.3 mLs (0.3 mg total) into the muscle as needed for anaphylaxis. 08/31/19  Yes Birdie Sons, MD  glucose blood test strip  04/02/06  Yes [provider]  loratadine (CLARITIN) 10 MG tablet Take 10 mg by mouth 3 (three) times daily as needed for allergies.   Yes [provider]  LORazepam (ATIVAN) 0.5 MG tablet TAKE 1 TABLET BY MOUTH 1 TO 2 TIMES DAILY AS NEEDED FOR ANXIETY 06/01/20  Yes Birdie Sons, MD  NONFORMULARY OR COMPOUNDED ITEM VoThy(T4) 60mg caps. One capsule daily 04/11/20  Yes FBirdie Sons MD  predniSONE (DELTASONE) 5 MG tablet TAKE 1 TABLET(5 MG) BY MOUTH DAILY 08/07/20  Yes FBirdie Sons MD  predniSONE (STERAPRED UNI-PAK 21 TAB) 10 MG (21) TBPK tablet PO: Take 6 tablets on day 1:Take 5 tablets day 2:Take 4 tablets day 3: Take 3 tablets day 4:Take 2 tablets day five: 5 Take 1 tablet day 6 07/20/20  Yes Flinchum, MKelby Aline FNP  ranitidine (ZANTAC) 75 MG tablet Take 75 mg by mouth as needed.   Yes [provider]  Coral Calcium-Magnesium-Vit D 200-100-100 MG-MG-UNIT CAPS Take 2 each by mouth 3 (three) times daily. Patient not taking: Reported on 08/30/2020 06/13/20   FBirdie Sons MD  Ferrous Sulfate (IRON SUPPLEMENT PO) Take 1 tablet by mouth  daily. Patient not taking: Reported on 08/30/2020    [provider]  montelukast (SINGULAIR) 10 MG tablet Take 1 tablet (10 mg total) by mouth at bedtime. 11/29/18 02/21/19  Fenton Foy, NP    Allergies as of 08/21/2020 - Review Complete 08/21/2020  Allergen Reaction Noted  . Codeine  12/11/2014  . Erythromycin  01/14/2011  . Other  01/14/2011  . Oxycodone Other (See Comments) 02/23/2014  . Oxycodone-acetaminophen Other (See Comments) and Nausea And Vomiting 02/23/2014  . Penicillins  01/14/2011  . Sugar-protein-starch  01/14/2011  . Sulfa drugs cross reactors   01/14/2011  . Vancomycin  06/22/2020    Family History  Problem Relation Age of Onset  . Cancer Mother        gallbladder  . Irritable bowel syndrome Mother   . Prostate cancer Father   . Heart disease Father   . Colon cancer Paternal Grandmother   . Renal cancer Maternal Grandmother     Social History   Socioeconomic History  . Marital status: Married    Spouse name: Not on file  . Number of children: 0  . Years of education: Not on file  . Highest education level: Not on file  Occupational History  . Occupation: Inpatient Coder  Tobacco Use  . Smoking status: Former Smoker    Packs/day: 0.30    Years: 10.00    Pack years: 3.00    Types: Cigarettes    Quit date: 01/16/1995    Years since quitting: 25.6  . Smokeless tobacco: Never Used  Substance and Sexual Activity  . Alcohol use: Yes    Alcohol/week: 0.0 standard drinks    Comment: occasional  . Drug use: No  . Sexual activity: Not on file  Other Topics Concern  . Not on file  Social History Narrative  . Not on file   Social Determinants of Health   Financial Resource Strain: Not on file  Food Insecurity: Not on file  Transportation Needs: Not on file  Physical Activity: Not on file  Stress: Not on file  Social Connections: Not on file  Intimate Partner Violence: Not on file    Review of Systems: See HPI, otherwise negative ROS  Physical Exam: BP 127/87   Pulse 67   Temp 97.8 F (36.6 C) (Temporal)   Resp 20   Ht 5' 5"  (1.651 m)   Wt 54.4 kg   LMP 02/14/2014   SpO2 99%   BMI 19.97 kg/m  General:   Alert,  pleasant and cooperative in NAD Head:  Normocephalic and atraumatic. Neck:  Supple; no masses or thyromegaly. Lungs:  Clear throughout to auscultation.    Heart:  Regular rate and rhythm. Abdomen:  Soft, nontender and nondistended. Normal bowel sounds, without guarding, and without rebound.   Neurologic:  Alert and  oriented x4;  grossly normal neurologically.  Impression/Plan: Dawn [name] is here for an colonoscopy to be performed for a history of adenomatous polyps in 2016   Risks, benefits, limitations, and alternatives regarding  colonoscopy have been reviewed with the patient.  Questions have been answered.  All parties agreeable.   Lucilla Lame, MD  09/10/2020, 9:42 AM

## 2020-09-10 NOTE — Transfer of Care (Signed)
Immediate Anesthesia Transfer of Care Note  Patient: Dawn [name]  Procedure(s) Performed: COLONOSCOPY WITH PROPOFOL (N/A Rectum)  Patient Location: PACU  Anesthesia Type: General  Level of Consciousness: awake, alert  and patient cooperative  Airway and Oxygen Therapy: Patient Spontanous Breathing and Patient connected to supplemental oxygen  Post-op Assessment: Post-op Vital signs reviewed, Patient's Cardiovascular Status Stable, Respiratory Function Stable, Patent Airway and No signs of Nausea or vomiting  Post-op Vital Signs: Reviewed and stable  Complications: No complications documented.

## 2020-09-10 NOTE — Anesthesia Postprocedure Evaluation (Signed)
Anesthesia Post Note  Patient: Dawn [name]  Procedure(s) Performed: COLONOSCOPY WITH PROPOFOL (N/A Rectum)     Patient location during evaluation: PACU Anesthesia Type: General Level of consciousness: awake Pain management: pain level controlled Vital Signs Assessment: post-procedure vital signs reviewed and stable Respiratory status: respiratory function stable Cardiovascular status: stable Postop Assessment: no signs of nausea or vomiting Anesthetic complications: no   No complications documented.  Veda Canning

## 2020-09-10 NOTE — Anesthesia Procedure Notes (Signed)
Date/Time: 09/10/2020 9:50 AM Performed by: Cameron Ali, CRNA Pre-anesthesia Checklist: Patient identified, Emergency Drugs available, Suction available, Timeout performed and Patient being monitored Patient Re-evaluated:Patient Re-evaluated prior to induction Oxygen Delivery Method: Nasal cannula Placement Confirmation: positive ETCO2

## 2020-09-10 NOTE — Anesthesia Preprocedure Evaluation (Addendum)
Anesthesia Evaluation  Patient identified by MRN, date of birth, ID band Patient awake    Reviewed: Allergy & Precautions, NPO status   Airway Mallampati: II  TM Distance: >3 FB     Dental   Pulmonary asthma , former smoker,    Pulmonary exam normal        Cardiovascular  Rhythm:Regular Rate:Normal  Raynaud's   Neuro/Psych  Headaches,    GI/Hepatic GERD  ,Ulcerative colitis   Endo/Other  Hypothyroidism   Renal/GU      Musculoskeletal Osteoporosis   Abdominal   Peds  Hematology   Anesthesia Other Findings   Reproductive/Obstetrics                             Anesthesia Physical Anesthesia Plan  ASA: II  Anesthesia Plan: General   Post-op Pain Management:    Induction: Intravenous  PONV Risk Score and Plan: Propofol infusion, TIVA and Treatment may vary due to age or medical condition  Airway Management Planned: Natural Airway and Nasal Cannula  Additional Equipment:   Intra-op Plan:   Post-operative Plan:   Informed Consent: I have reviewed the patients History and Physical, chart, labs and discussed the procedure including the risks, benefits and alternatives for the proposed anesthesia with the patient or authorized representative who has indicated his/her understanding and acceptance.       Plan Discussed with: CRNA  Anesthesia Plan Comments:        Anesthesia Quick Evaluation

## 2020-09-10 NOTE — Op Note (Signed)
[facility] Patient Name: Dawn [name] Procedure Date: 09/10/2020 9:42 AM MRN: [id number] Account #: [id number] Date of Birth: 1966-03-05 Admit Type: Outpatient Age: 55 Room: [facility] OR ROOM 01 Gender: Female Note Status: Finalized Procedure:             Colonoscopy Indications:           High risk colon cancer surveillance: Personal history                         of colonic polyps Providers:             Lucilla Lame MD, MD Referring MD:          Kirstie Peri. Caryn Section, MD (Referring MD) Medicines:             Propofol per Anesthesia Complications:         No immediate complications. Procedure:             Pre-Anesthesia Assessment:                        - Prior to the procedure, a History and Physical was                         performed, and patient medications and allergies were                         reviewed. The patient's tolerance of previous                         anesthesia was also reviewed. The risks and benefits                         of the procedure and the sedation options and risks                         were discussed with the patient. All questions were                         answered, and informed consent was obtained. Prior                         Anticoagulants: The patient has taken no previous                         anticoagulant or antiplatelet agents. ASA Grade                         Assessment: II - A patient with mild systemic disease.                         After reviewing the risks and benefits, the patient                         was deemed in satisfactory condition to undergo the                         procedure.  After obtaining informed consent, the colonoscope was                         passed under direct vision. Throughout the procedure,                         the patient's blood pressure, pulse, and oxygen                         saturations were monitored continuously. The was                          introduced through the anus and advanced to the the                         cecum, identified by appendiceal orifice and ileocecal                         valve. The colonoscopy was performed without                         difficulty. The patient tolerated the procedure well.                         The quality of the bowel preparation was excellent. Findings:      The perianal and digital rectal examinations were normal.      Multiple small-mouthed diverticula were found in the sigmoid colon.      Non-bleeding internal hemorrhoids were found during retroflexion. The       hemorrhoids were Grade II (internal hemorrhoids that prolapse but reduce       spontaneously). Impression:            - Diverticulosis in the sigmoid colon.                        - Non-bleeding internal hemorrhoids.                        - No specimens collected. Recommendation:        - Discharge patient to home.                        - Resume previous diet.                        - Continue present medications.                        - Repeat colonoscopy in 5 years for surveillance. Procedure Code(s):     --- Professional ---                        [phone], Colonoscopy, flexible; diagnostic, including                         collection of specimen(s) by brushing or washing, when                         performed (separate procedure) Diagnosis Code(s):     --- Professional ---  Z86.010, Personal history of colonic polyps CPT copyright 2019 American Medical Association. All rights reserved. The codes documented in this report are preliminary and upon coder review may  be revised to meet current compliance requirements. Lucilla Lame MD, MD 09/10/2020 10:13:07 AM This report has been signed electronically. Number of Addenda: 0 Note Initiated On: 09/10/2020 9:42 AM Scope Withdrawal Time: 0 hours 9 minutes 19 seconds  Total Procedure Duration: 0 hours 15 minutes 29 seconds   Estimated Blood Loss:  Estimated blood loss: none.      [facility]

## 2020-09-11 ENCOUNTER — Encounter: Payer: Self-pay | Admitting: Gastroenterology

## 2020-10-03 ENCOUNTER — Other Ambulatory Visit: Payer: Self-pay | Admitting: Family Medicine

## 2020-10-03 DIAGNOSIS — F418 Other specified anxiety disorders: Secondary | ICD-10-CM

## 2020-10-03 NOTE — Telephone Encounter (Signed)
Requested medication (s) are due for refill today: yes   Requested medication (s) are on the active medication list: yes   Last refill:  09/03/2020  Future visit scheduled: no   Notes to clinic:  this refill cannot be delegated    Requested Prescriptions  Pending Prescriptions Disp Refills   LORazepam (ATIVAN) 0.5 MG tablet [Pharmacy Med Name: LORAZEPAM 0.5MG TABLETS] 60 tablet     Sig: TAKE 1 TABLET BY MOUTH 1 TO 2 TIMES DAILY AS NEEDED FOR ANXIETY      Not Delegated - Psychiatry:  Anxiolytics/Hypnotics Failed - 10/03/2020 10:59 AM      Failed - This refill cannot be delegated      Failed - Urine Drug Screen completed in last 360 days      Failed - Valid encounter within last 6 months    Recent Outpatient Visits           2 months ago Pneumonia due to infectious organism, unspecified laterality, unspecified part of lung   [place] Flinchum, Kelby Aline, FNP   10 months ago Mast cell activation syndrome [facility])   [facility] Birdie Sons, MD   1 year ago Acute cystitis with hematuria   [facility] Trinna Post, [place]   1 year ago Situational anxiety   [facility] Birdie Sons, MD   1 year ago [place], [employer], [place]       Future Appointments             In 1 month Chesley Mires, MD [place]

## 2020-10-11 ENCOUNTER — Other Ambulatory Visit: Payer: Self-pay | Admitting: Family Medicine

## 2020-10-11 DIAGNOSIS — E039 Hypothyroidism, unspecified: Secondary | ICD-10-CM

## 2020-10-11 NOTE — Telephone Encounter (Signed)
She's been taking the compounded thyroid medication (NONFORMULARY VoThy T4). Is she wanting to switch to levothyroxine?

## 2020-10-11 NOTE — Telephone Encounter (Signed)
Medication Refill - Medication: levothyroxine (SYNTHROID, LEVOTHROID) 75 MCG tablet  Has the patient contacted their pharmacy? Yes.   Pharmacy tried sending over, but was rejected for unknown reason  Preferred Pharmacy (with phone number or street name): [place], [place] - [place]  Phone:  [phone] Fax:  [phone]  Agent: Please be advised that RX refills may take up to 3 business days. We ask that you follow-up with your pharmacy.

## 2020-10-12 MED ORDER — NONFORMULARY OR COMPOUNDED ITEM: 12 refills | Status: AC

## 2020-10-12 NOTE — Telephone Encounter (Signed)
Ms. Brillhart would like to stay on VoThy T4.  Please send to [facility] in [place].    Thanks,   -Mickel Baas

## 2020-10-31 ENCOUNTER — Encounter: Payer: Self-pay | Admitting: Advanced Practice Midwife

## 2020-10-31 ENCOUNTER — Other Ambulatory Visit (HOSPITAL_COMMUNITY)
Admission: RE | Admit: 2020-10-31 | Discharge: 2020-10-31 | Disposition: A | Discharge status: Home / Self Care | Payer: BC Managed Care – PPO | Source: Ambulatory Visit | Attending: Advanced Practice Midwife | Admitting: Advanced Practice Midwife

## 2020-10-31 ENCOUNTER — Other Ambulatory Visit: Payer: Self-pay

## 2020-10-31 ENCOUNTER — Ambulatory Visit (INDEPENDENT_AMBULATORY_CARE_PROVIDER_SITE_OTHER): Payer: BC Managed Care – PPO | Admitting: Advanced Practice Midwife

## 2020-10-31 VITALS — BP 100/70 | Ht 65.0 in | Wt 124.0 lb

## 2020-10-31 DIAGNOSIS — Z124 Encounter for screening for malignant neoplasm of cervix: Secondary | ICD-10-CM

## 2020-10-31 DIAGNOSIS — Z01419 Encounter for gynecological examination (general) (routine) without abnormal findings: Secondary | ICD-10-CM | POA: Diagnosis present

## 2020-10-31 NOTE — Progress Notes (Signed)
Gynecology Annual Exam  PCP: Birdie Sons, MD  Chief Complaint:  Chief Complaint  Patient presents with   Annual Exam    History of Present Illness:Patient is a 55 y.o. No obstetric history on file. presents for annual exam. The patient has no gyn complaints today. Her last PAP smear was 5 or 6 years ago per her report and she has always had normal results. Her colonoscopy done in May of this year was normal. She is on an every 5 year schedule due to history. She has Mast Cell disorder and struggles with many auto immune issues. Her PCP is [facility]. She had a mammogram a number of years ago. She declines mammogram due to avoiding more radiation- she has many x-rays due to her medical history. She also has pain with mammogram from a decrease in pectoral muscle mass.   LMP: Patient's last menstrual period was 09/24/2017 (approximate). She reports her LMP as approximately 2 or 3 years ago.  The patient is not sexually active. She denies dyspareunia.  The patient does perform self breast exams.  There is no notable family history of breast or ovarian cancer in her family.  The patient wears seatbelts: yes.   The patient has regular exercise: she lifts weights, does yoga and low impact workouts, she admits healthy lifestyle diet, hydration and sleep.  She takes steroids long term to manage her Mast Cell disorder and supplements calcium, magnesium, vitamin D to manage bone health.  The patient denies current symptoms of depression.  She does take xanax with good results due to anxiety from long term medical issues.  Review of Systems: Review of Systems  Constitutional:  Negative for chills and fever.  HENT:  Negative for congestion, ear discharge, ear pain, hearing loss, sinus pain and sore throat.   Eyes:  Negative for blurred vision and double vision.  Respiratory:  Positive for shortness of breath. Negative for cough and wheezing.   Cardiovascular:  Negative for  chest pain, palpitations and leg swelling.  Gastrointestinal:  Positive for abdominal pain. Negative for blood in stool, constipation, diarrhea, heartburn, melena, nausea and vomiting.  Genitourinary:  Negative for dysuria, flank pain, frequency, hematuria and urgency.  Musculoskeletal:  Positive for myalgias. Negative for back pain and joint pain.  Skin:  Negative for itching and rash.  Neurological:  Negative for dizziness, tingling, tremors, sensory change, speech change, focal weakness, seizures, loss of consciousness, weakness and headaches.  Endo/Heme/Allergies:  Positive for environmental allergies. Does not bruise/bleed easily.  Psychiatric/Behavioral:  Negative for depression, hallucinations, memory loss, substance abuse and suicidal ideas. The patient is not nervous/anxious and does not have insomnia.    Past Medical History:  Patient Active Problem List   Diagnosis Date Noted   Pneumonia due to infectious organism 07/20/2020   History of colonic polyps 12/06/2019    By patient report      Mast cell activation syndrome ([place]) 02/05/2018    By patient report. Anticipated evaluation by Dr. Bernerd Limbo      Current chronic use of systemic steroids 08/18/2016   DOE (dyspnea on exertion) 11/02/2015   Bilateral headaches 11/01/2015   Weakness 11/01/2015   Arthralgia 12/11/2014   Headache 12/11/2014   History of anaphylaxis 12/11/2014   Acquired hypothyroidism 12/11/2014   Raynaud's phenomenon 12/11/2014   Rosacea 12/11/2014   Fatigue 12/11/2014   Asthma, chronic 02/26/2014    Onset as teenager     History of insect sting allergy 02/26/2014  Hymenoptera, carries EpiPen     Sjogren syndrome, unspecified ([place]) 12/27/2008   Allergy to pollen 02/23/2006   Osteoporosis 02/23/2006    AP Spine L1-L4 05/20/2018 54.6 Osteoporosis -2.7 0.857 g/cm2 DualFemur Total Left 05/20/2018 54.6 Osteoporosis -2.6 0.677 g/cm2 Left Forearm Radius 33% 05/20/2018 54.6 Normal -0.8 0.806  g/cm2  Alendronate prescribed 05/20/2018     Asthma, exogenous 02/23/2006   Nicotine dependence 04/29/2003   Psoriasis 12/28/1998   Situational anxiety 04/28/1998    Past Surgical History:  Past Surgical History:  Procedure Laterality Date   COLONOSCOPY WITH PROPOFOL N/A 09/10/2020   Procedure: COLONOSCOPY WITH PROPOFOL;  Surgeon: Lucilla Lame, MD;  Location: [facility];  Service: Endoscopy;  Laterality: N/A;   ELECTROMYOGRAPHY  06/13/15   UE EMG Normal   SHOULDER SURGERY      Gynecologic History:  Patient's last menstrual period was 09/24/2017 (approximate). Last Pap: 5 or 6 years ago Results were:  no abnormalities  Last mammogram: in remote past Results were: BI-RAD I  Obstetric History: No obstetric history on file.  Family History:  Family History  Problem Relation Age of Onset   Cancer Mother        gallbladder   Irritable bowel syndrome Mother    Prostate cancer Father    Heart disease Father    Colon cancer Paternal Grandmother    Renal cancer Maternal Grandmother     Social History:  Social History   Socioeconomic History   Marital status: Married    Spouse name: Not on file   Number of children: 0   Years of education: Not on file   Highest education level: Not on file  Occupational History   Occupation: Inpatient Coder  Tobacco Use   Smoking status: Former    Packs/day: 0.30    Years: 10.00    Pack years: 3.00    Types: Cigarettes    Quit date: 01/16/1995    Years since quitting: 25.8   Smokeless tobacco: Never  Substance and Sexual Activity   Alcohol use: Yes    Alcohol/week: 0.0 standard drinks    Comment: occasional   Drug use: No   Sexual activity: Not on file  Other Topics Concern   Not on file  Social History Narrative   Not on file   Social Determinants of Health   Financial Resource Strain: Not on file  Food Insecurity: Not on file  Transportation Needs: Not on file  Physical Activity: Not on file  Stress: Not on  file  Social Connections: Not on file  Intimate Partner Violence: Not on file    Allergies:  Allergies  Allergen Reactions   Codeine    Erythromycin    Other     ANTICHOLINERGIC MEDICATIONS    Oxycodone Other (See Comments)   Oxycodone-Acetaminophen Other (See Comments) and Nausea And Vomiting   Penicillins    Sugar-Protein-Starch    Sulfa Drugs Cross Reactors    Vancomycin     Medications: Prior to Admission medications   Medication Sig Start Date End Date Taking? Authorizing Provider  albuterol (VENTOLIN HFA) 108 (90 Base) MCG/ACT inhaler INHALE 2 PUFFS INTO THE LUNGS EVERY 6 HOURS AS NEEDED FOR WHEEZING OR SHORTNESS OF BREATH 08/31/19  Yes Birdie Sons, MD  alendronate (FOSAMAX) 70 MG tablet TAKE 1 TABLET(70 MG) BY MOUTH 1 TIME A WEEK WITH A FULL GLASS OF WATER AND ON AN EMPTY STOMACH 08/28/20  Yes Birdie Sons, MD  budesonide-formoterol (SYMBICORT) 160-4.5 MCG/ACT inhaler Inhale 2 puffs  into the lungs 2 (two) times daily. 08/17/17  Yes Chesley Mires, MD  CALCIUM-MAGNESIUM-VITAMIN D PO Take 1 capsule by mouth 2 (two) times daily.   Yes [provider]  Coral Calcium-Magnesium-Vit D 200-100-100 MG-MG-UNIT CAPS Take 2 each by mouth 3 (three) times daily. 06/13/20  Yes Birdie Sons, MD  cromolyn (GASTROCROM) 100 MG/5ML solution Take by mouth. 01/04/20 01/03/21 Yes [provider]  EPINEPHrine 0.3 mg/0.3 mL IJ SOAJ injection Inject 0.3 mLs (0.3 mg total) into the muscle as needed for anaphylaxis. 08/31/19  Yes Birdie Sons, MD  Ferrous Sulfate (IRON SUPPLEMENT PO) Take 1 tablet by mouth daily.   Yes [provider]  glucose blood test strip  04/02/06  Yes [provider]  loratadine (CLARITIN) 10 MG tablet Take 10 mg by mouth 3 (three) times daily as needed for allergies.   Yes [provider]  LORazepam (ATIVAN) 0.5 MG tablet TAKE 1 TABLET BY MOUTH 1 TO 2 TIMES DAILY AS NEEDED FOR ANXIETY 10/04/20  Yes Birdie Sons, MD  NONFORMULARY  OR COMPOUNDED ITEM VoThy(T4) 6mg caps. One capsule daily 10/12/20  Yes Fisher, DKirstie Peri MD  predniSONE (DELTASONE) 5 MG tablet TAKE 1 TABLET(5 MG) BY MOUTH DAILY 08/07/20  Yes FBirdie Sons MD  predniSONE (STERAPRED UNI-PAK 21 TAB) 10 MG (21) TBPK tablet PO: Take 6 tablets on day 1:Take 5 tablets day 2:Take 4 tablets day 3: Take 3 tablets day 4:Take 2 tablets day five: 5 Take 1 tablet day 6 07/20/20  Yes Flinchum, MKelby Aline FNP  ranitidine (ZANTAC) 75 MG tablet Take 75 mg by mouth as needed.   Yes [provider]  montelukast (SINGULAIR) 10 MG tablet Take 1 tablet (10 mg total) by mouth at bedtime. 11/29/18 02/21/19  NFenton Foy NP    Physical Exam Vitals: Blood pressure 100/70, height 5' 5"  (1.651 m), weight 124 lb (56.2 kg), last menstrual period 02/14/2014.  General: NAD HEENT: normocephalic, anicteric Thyroid: no enlargement, no palpable nodules Pulmonary: No increased work of breathing, CTAB Cardiovascular: RRR, distal pulses 2+ Breast: Breast symmetrical, no tenderness, no palpable nodules or masses, no skin or nipple retraction present, no nipple discharge.  No axillary or supraclavicular lymphadenopathy. Abdomen: NABS, soft, non-tender, non-distended.  Umbilicus without lesions.  No hepatomegaly, splenomegaly or masses palpable. No evidence of hernia  Genitourinary:  External: Normal external female genitalia.  Normal urethral meatus, normal Bartholin's and Skene's glands.    Vagina: Normal vaginal mucosa, no evidence of prolapse.    Cervix: polyp at 5:30, scant bleeding Extremities: no edema, erythema, or tenderness Neurologic: Grossly intact Psychiatric: mood appropriate, affect full    Assessment: 55y.o. No obstetric history on file. routine annual exam  Plan: Problem List Items Addressed This Visit   None Visit Diagnoses     Well woman exam with routine gynecological exam    -  Primary   Relevant Orders   Cytology - PAP   Screening for cervical  cancer       Relevant Orders   Cytology - PAP       1) Mammogram - recommend yearly screening mammogram.  Mammogram  declined  2) STI screening  was offered and declined  3) ASCCP guidelines and rationale discussed.  Patient opts for every 5 years screening interval  4) Osteoporosis  - per USPTF routine screening DEXA at age 57 Consider FDA-approved medical therapies in postmenopausal women and men aged 588years and older, based on the following: a)  A hip or vertebral (clinical or morphometric) fracture b) T-score ? -2.5 at the femoral neck or spine after appropriate evaluation to exclude secondary causes C) Low bone mass (T-score between -1.0 and -2.5 at the femoral neck or spine) and a 10-year probability of a hip fracture ? 3% or a 10-year probability of a major osteoporosis-related fracture ? 20% based on the [place]-adapted WHO algorithm   5) Routine healthcare maintenance including cholesterol, diabetes screening discussed managed by PCP  6) Colonoscopy is up to date.  Screening recommended starting at age 38 for average risk individuals, age 29 for individuals deemed at increased risk (including African Americans) and recommended to continue until age 44.  For patient age 97-85 individualized approach is recommended.  Gold standard screening is via colonoscopy, Cologuard screening is an acceptable alternative for patient unwilling or unable to undergo colonoscopy.  "Colorectal cancer screening for average?risk adults: 2018 guideline update from the [place]: A Cancer Journal for Clinicians: Sep 24, 2016   7) Return as needed for gyn concerns/PAP smear in 5 years    Christean Leaf, [facility] 10/31/20, 9:09 AM

## 2020-11-07 LAB — CYTOLOGY - PAP
Adequacy: ABNORMAL
Comment: NEGATIVE

## 2020-11-08 ENCOUNTER — Encounter: Payer: Self-pay | Admitting: Pulmonary Disease

## 2020-11-08 ENCOUNTER — Ambulatory Visit: Payer: BC Managed Care – PPO | Admitting: Pulmonary Disease

## 2020-11-08 ENCOUNTER — Other Ambulatory Visit: Payer: Self-pay

## 2020-11-08 VITALS — BP 88/70 | HR 79 | Temp 98.0°F | Ht 65.0 in | Wt 124.0 lb

## 2020-11-08 DIAGNOSIS — D8940 Mast cell activation, unspecified: Secondary | ICD-10-CM

## 2020-11-08 DIAGNOSIS — J45909 Unspecified asthma, uncomplicated: Secondary | ICD-10-CM | POA: Diagnosis not present

## 2020-11-08 DIAGNOSIS — R911 Solitary pulmonary nodule: Secondary | ICD-10-CM | POA: Diagnosis not present

## 2020-11-08 NOTE — Patient Instructions (Signed)
Follow up in 1 year.

## 2020-11-08 NOTE — Progress Notes (Signed)
Dawn [name] Dawn [name] [name], Dawn [name] Dawn [name] [name], Dawn [name] Dawn [name] [name]  Chief Complaint  Patient presents with   Follow-up    Constitutional:  BP (!) 88/70 (BP Location: Left Arm, Patient Position: Sitting, Cuff Size: Normal)   Pulse 79   Temp 98 F (36.7 C) (Oral)   Ht 5' 5"  (1.651 m)   Wt 124 lb (56.2 kg)   LMP 09/24/2017 (Approximate) Comment: Patient thinks last period was 2 or 3 years ago  SpO2 98%   BMI 20.63 kg/m   Past Medical History:  Rheumatoid arthritis, Psoriasis, Sjogren's disease, Ulcerative colitis, Autoimmune thyroiditis, Mast cell activation syndrome, Anxiety, OA, Osteoporosis, Raynaud phenomenon  Past Surgical History:  She  has a past surgical history that includes Shoulder surgery; Electromyography (06/13/15); Dawn [name] Colonoscopy with propofol (N/A, 09/10/2020).  Brief Summary:  Dawn [name] Dawn [name] [name] is a 55 y.o. female former smoker with allergic asthma Dawn [name] mast cell disorder.      Subjective:   Had CT chest in April.  Lung nodules stable.  Not having cough, wheeze, sputum, or chest congestion.  Had colonoscopy a month ago with Dr. Allen Norris Dawn [name] was told she didn't have any polyps.  Has follow up with immunology next month.  Physical Exam:   Appearance - well kempt   ENMT - no sinus tenderness, no oral exudate, no LAN, Mallampati 2 airway, no stridor  Respiratory - equal breath sounds bilaterally, no wheezing or rales  CV - s1s2 regular rate Dawn [name] rhythm, no murmurs  Ext - no clubbing, no edema  Skin - no rashes  Psych - normal mood Dawn [name] affect  Dawn [name] [name] testing:  RAST 02/23/14 >> positive RAST 08/17/17 >> dust mites, mold PFT 10/12/17 >> FEV1 1.81 (64%), FEV1% 68, unable to do lung volumes, DLCO 91%, +BD  Chest Imaging:  CT chest 07/11/15 >> minimal CAD, upper lobe patchy opacities Rt > Lt, pectus excavatum CT chest 09/29/17 >> GGO RUL no change, 6 mm nodule LUL CT chest 08/23/20 >> atherosclerosis, numerous areas of GGO w/o change  Social History:  She  reports that she  quit smoking about 25 years ago. Her smoking use included cigarettes. She has a 3.00 pack-year smoking history. She has never used smokeless tobacco. She reports current alcohol use. She reports that she does not use drugs.  Family History:  Her family history includes Cancer in her mother; Colon cancer in her paternal grandmother; Heart disease in her father; Irritable bowel syndrome in her mother; Prostate cancer in her father; Renal cancer in her maternal grandmother.    Discussion:  She has hx of allergic asthma Dawn [name] rhinitis.  She had CT chest in 2017 that showed upper lobe opacities Dawn [name] pectus excavatum.  She has hx of psoriatic arthritis, Sjogren's, Dawn [name] inflammatory bowel disease.  She is on chronic prednisone for IBD.  She was intolerant of MTX previously.  She reports having mast activation syndrome.  Assessment/Plan:   Allergic asthma Dawn [name] rhinitis. - continue symbicort, claritin - prn albuterol   Mast cell activation syndrome. - followed by Dr. Trixie Rude Lugar with [facility] allergy/immunology   Lung nodule. - stable on most recent CT chest - will need follow up CT chest w/o contrast in April 2024   Hx of psoriatic arthritis, Sjogren's, IBD. - she is maintained on prednisone - followed by GI - no recent rheumatology visits  Time Spent Involved in Patient Dawn [name] [name] on Day of Examination:  23 minutes  Follow up:   Patient Instructions  Follow up in 1 year  Medication List:  Allergies as of 11/08/2020       Reactions   Codeine    Erythromycin    Other    ANTICHOLINERGIC MEDICATIONS    Oxycodone Other (See Comments)   Oxycodone-acetaminophen Other (See Comments), Nausea Dawn [name] Vomiting   Penicillins    Sugar-protein-starch    Sulfa Drugs Cross Reactors    Vancomycin         Medication List        Accurate as of November 08, 2020  9:48 AM. If you have any questions, ask your nurse or doctor.          albuterol 108 (90 Base) MCG/ACT inhaler Commonly known as:  VENTOLIN HFA INHALE 2 PUFFS INTO THE LUNGS EVERY 6 HOURS AS NEEDED FOR WHEEZING OR SHORTNESS OF BREATH   alendronate 70 MG tablet Commonly known as: FOSAMAX TAKE 1 TABLET(70 MG) BY MOUTH 1 TIME A WEEK WITH A FULL GLASS OF WATER Dawn [name] ON AN EMPTY STOMACH   budesonide-formoterol 160-4.5 MCG/ACT inhaler Commonly known as: SYMBICORT Inhale 2 puffs into the lungs 2 (two) times daily.   Coral Calcium-Magnesium-Vit D 200-100-100 MG-MG-UNIT Caps Take 2 each by mouth 3 (three) times daily.   cromolyn 100 MG/5ML solution Commonly known as: GASTROCROM Take by mouth.   EPINEPHrine 0.3 mg/0.3 mL Soaj injection Commonly known as: EPI-PEN Inject 0.3 mLs (0.3 mg total) into the muscle as needed for anaphylaxis.   glucose blood test strip   IRON SUPPLEMENT PO Take 1 tablet by mouth daily.   loratadine 10 MG tablet Commonly known as: CLARITIN Take 10 mg by mouth 3 (three) times daily as needed for allergies.   LORazepam 0.5 MG tablet Commonly known as: ATIVAN TAKE 1 TABLET BY MOUTH 1 TO 2 TIMES DAILY AS NEEDED FOR ANXIETY   NONFORMULARY OR COMPOUNDED ITEM VoThy(T4) 74mg caps. One capsule daily   predniSONE 5 MG tablet Commonly known as: DELTASONE TAKE 1 TABLET(5 MG) BY MOUTH DAILY What changed: Another medication with the same name was removed. Continue taking this medication, Dawn [name] follow the directions you see here. Changed by: VChesley Mires MD   ranitidine 75 MG tablet Commonly known as: ZANTAC Take 75 mg by mouth as needed.        Signature:  VChesley Mires MD LGuraboPager - ([phone]/14/2022, 9:48 AM

## 2020-11-13 NOTE — Telephone Encounter (Signed)
Opal Sidles replied to patient thru my chart from lab result note to notify of need for repeat pap.

## 2020-11-24 ENCOUNTER — Other Ambulatory Visit: Payer: Self-pay | Admitting: Family Medicine

## 2020-11-24 DIAGNOSIS — M810 Age-related osteoporosis without current pathological fracture: Secondary | ICD-10-CM

## 2020-11-24 NOTE — Telephone Encounter (Signed)
Requested medications are due for refill today yes  Requested medications are on the active medication list yes  Last refill 5/3  Last visit pna 06/2020, other 11/2019, lab 08/2019  Future visit scheduled no  Notes to clinic Failed protocol due to no labs within 360 days, no upcoming visit scheduled.

## 2021-01-25 ENCOUNTER — Other Ambulatory Visit: Payer: Self-pay | Admitting: Family Medicine

## 2021-01-25 DIAGNOSIS — K50814 Crohn's disease of both small and large intestine with abscess: Secondary | ICD-10-CM

## 2021-01-25 DIAGNOSIS — J45909 Unspecified asthma, uncomplicated: Secondary | ICD-10-CM

## 2021-01-25 NOTE — Telephone Encounter (Signed)
Requested medication (s) are due for refill today:   Provider to review  Requested medication (s) are on the active medication list:   Yes  Future visit scheduled:   No   Last ordered: 08/07/2020 #30, 5 refills  Non delegated refill   Requested Prescriptions  Pending Prescriptions Disp Refills   predniSONE (DELTASONE) 5 MG tablet [Pharmacy Med Name: PREDNISONE 5MG TABLETS] 30 tablet 5    Sig: TAKE 1 TABLET(5 MG) BY MOUTH DAILY     Not Delegated - Endocrinology:  Oral Corticosteroids Failed - 01/25/2021 10:34 AM      Failed - This refill cannot be delegated      Failed - Last BP in normal range    BP Readings from Last 1 Encounters:  11/08/20 (!) 88/70          Failed - Valid encounter within last 6 months    Recent Outpatient Visits           6 months ago Pneumonia due to infectious organism, unspecified laterality, unspecified part of lung   [facility] Flinchum, Kelby Aline, FNP   1 year ago Mast cell activation syndrome [facility])   [facility] Birdie Sons, MD   1 year ago Acute cystitis with hematuria   [facility] Trinna Post, [place]   1 year ago Situational anxiety   [facility] Birdie Sons, MD   1 year ago Ney Verona, Fabio Bering [place], [place]

## 2021-02-24 ENCOUNTER — Other Ambulatory Visit: Payer: Self-pay | Admitting: Family Medicine

## 2021-02-24 DIAGNOSIS — F418 Other specified anxiety disorders: Secondary | ICD-10-CM

## 2021-02-24 NOTE — Telephone Encounter (Signed)
Pt due refills.  Last OV 10/04/20 #60 4 RF. Med is active nad pt has a FVS which is virtual. Pt is immunocompromised. Appt is 05/04/20 @ 1300.  Requested Prescriptions  Pending Prescriptions Disp Refills   LORazepam (ATIVAN) 0.5 MG tablet [Pharmacy Med Name: LORAZEPAM 0.5MG TABLETS] 60 tablet     Sig: TAKE 1 TABLET BY MOUTH 1 TO 2 TIMES DAILY AS NEEDED FOR ANXIETY     Not Delegated - Psychiatry:  Anxiolytics/Hypnotics Failed - 02/24/2021 12:30 PM      Failed - This refill cannot be delegated      Failed - Urine Drug Screen completed in last 360 days      Failed - Valid encounter within last 6 months    Recent Outpatient Visits           7 months ago Pneumonia due to infectious organism, unspecified laterality, unspecified part of lung   [facility] Flinchum, Kelby Aline, FNP   1 year ago Mast cell activation syndrome [facility])   [facility] Birdie Sons, MD   1 year ago Acute cystitis with hematuria   [facility] Trinna Post, [place]   1 year ago Situational anxiety   [facility] Birdie Sons, MD   1 year ago [place], [place], [place]       Future Appointments             In 1 week Mcarthur Rossetti, MD [place]   In 1 week Caryn Section, Kirstie Peri, MD [facility], [place]

## 2021-03-04 ENCOUNTER — Ambulatory Visit: Payer: BC Managed Care – PPO | Admitting: Orthopaedic Surgery

## 2021-03-04 ENCOUNTER — Telehealth (INDEPENDENT_AMBULATORY_CARE_PROVIDER_SITE_OTHER): Payer: BC Managed Care – PPO | Admitting: Family Medicine

## 2021-03-04 ENCOUNTER — Ambulatory Visit: Payer: Self-pay

## 2021-03-04 DIAGNOSIS — M545 Low back pain, unspecified: Secondary | ICD-10-CM

## 2021-03-04 DIAGNOSIS — M810 Age-related osteoporosis without current pathological fracture: Secondary | ICD-10-CM | POA: Diagnosis not present

## 2021-03-04 DIAGNOSIS — J45909 Unspecified asthma, uncomplicated: Secondary | ICD-10-CM | POA: Diagnosis not present

## 2021-03-04 DIAGNOSIS — Z87892 Personal history of anaphylaxis: Secondary | ICD-10-CM

## 2021-03-04 DIAGNOSIS — G8929 Other chronic pain: Secondary | ICD-10-CM

## 2021-03-04 DIAGNOSIS — F418 Other specified anxiety disorders: Secondary | ICD-10-CM | POA: Diagnosis not present

## 2021-03-04 MED ORDER — ALENDRONATE SODIUM 70 MG PO TABS
70.0000 mg | ORAL_TABLET | ORAL | 4 refills | Status: DC
Start: 2021-03-04 — End: 2022-04-23

## 2021-03-04 MED ORDER — EPINEPHRINE 0.3 MG/0.3ML IJ SOAJ: 0.3000 mg | INTRAMUSCULAR | 4 refills | Status: DC | PRN

## 2021-03-04 MED ORDER — LORAZEPAM 0.5 MG PO TABS: ORAL_TABLET | ORAL | 5 refills | Status: DC

## 2021-03-04 MED ORDER — METHOCARBAMOL 500 MG PO TABS: 500.0000 mg | ORAL_TABLET | Freq: Four times a day (QID) | ORAL | 1 refills | Status: DC | PRN

## 2021-03-04 MED ORDER — ALBUTEROL SULFATE HFA 108 (90 BASE) MCG/ACT IN AERS: INHALATION_SPRAY | RESPIRATORY_TRACT | 5 refills | Status: DC

## 2021-03-04 NOTE — Progress Notes (Signed)
MyChart Video Visit    Virtual Visit via Video Note   This visit type was conducted due to national recommendations for restrictions regarding the COVID-19 Pandemic (e.g. social distancing) in an effort to limit this patient's exposure and mitigate transmission in our community. This patient is at least at moderate risk for complications without adequate follow up. This format is felt to be most appropriate for this patient at this time. Physical exam was limited by quality of the video and audio technology used for the visit.   Patient location: home Provider location: [facility]  I discussed the limitations of evaluation and management by telemedicine and the availability of in person appointments. The patient expressed understanding and agreed to proceed.  Patient: Dawn [name]   DOB: May 20, 1965   55 y.o. Female  MRN: [id number] Visit Date: 03/04/2021  Today's healthcare provider: Lelon Huh, MD   No chief complaint on file.  Subjective    HPI  Anxiety, Follow-up  She was last seen for anxiety more than 1 year ago.   Changes made at last visit include none.   She reports excellent compliance with treatment. She reports excellent tolerance of treatment. She is not having side effects.   She feels her anxiety is  stable  since last visit.  Symptoms: No chest pain No difficulty concentrating  No dizziness No fatigue  No feelings of losing control No insomnia  No irritable No palpitations  No panic attacks No racing thoughts  No shortness of breath No sweating  No tremors/shakes    GAD-7 Results GAD-7 Generalized Anxiety Disorder Screening Tool 08/31/2019  1. Feeling Nervous, Anxious, or on Edge 0  2. Not Being Able to Stop or Control Worrying 1  3. Worrying Too Much About Different Things 1  4. Trouble Relaxing 0  5. Being So Restless it's Hard To Sit Still 0  6. Becoming Easily Annoyed or Irritable 1  7. Feeling Afraid As If Something Awful Might Happen 0  Total  GAD-7 Score 3  Difficulty At Work, Home, or Getting  Along With Others? Not difficult at all    PHQ-9 Scores PHQ9 SCORE ONLY 12/06/2019 08/31/2019  PHQ-9 Total Score 0 0    ---------------------------------------------------------------------------------------------------   Hypothyroid, follow-up  Lab Results  Component Value Date   TSH 11.100 (H) 08/31/2019   TSH 5.870 (H) 04/23/2018   TSH 13.490 (H) 01/14/2016   FREET4 1.44 08/31/2019   FREET4 1.48 04/23/2018   T4TOTAL 6.8 01/14/2016   Wt Readings from Last 3 Encounters:  11/08/20 124 lb (56.2 kg)  10/31/20 124 lb (56.2 kg)  08/30/20 120 lb (54.4 kg)    She was last seen for hypothyroid more than 1 year ago.   Management since that visit includes increasing VoThy (T4) to 88 mcg, one capsule daily. She reports excellent compliance with treatment. She is not having side effects.   Symptoms: No change in energy level No constipation  No diarrhea No heat / cold intolerance  No nervousness No palpitations  No weight changes    -----------------------------------------------------------------------------------------  Reports that since having Covid earlier this year he has had three episodes of prolonged back pain and psoriasis flare ups. Is currently improving from most recent flare. Seen orthopedist related and recommended Voltaren gel. [facility]ate [facility]   Medications: Outpatient Medications Prior to Visit  Medication Sig   albuterol (VENTOLIN HFA) 108 (90 Base) MCG/ACT inhaler INHALE 2 PUFFS INTO THE LUNGS EVERY 6 HOURS AS NEEDED FOR  WHEEZING OR SHORTNESS OF BREATH   alendronate (FOSAMAX) 70 MG tablet TAKE 1 TABLET(70 MG) BY MOUTH 1 TIME A WEEK WITH A FULL GLASS OF WATER AND ON AN EMPTY STOMACH   budesonide-formoterol (SYMBICORT) 160-4.5 MCG/ACT inhaler Inhale 2 puffs into the lungs 2 (two) times daily.   Coral Calcium-Magnesium-Vit D 200-100-100 MG-MG-UNIT CAPS Take 2 each by  mouth 3 (three) times daily.   EPINEPHrine 0.3 mg/0.3 mL IJ SOAJ injection Inject 0.3 mLs (0.3 mg total) into the muscle as needed for anaphylaxis.   Ferrous Sulfate (IRON SUPPLEMENT PO) Take 1 tablet by mouth daily.   glucose blood test strip    loratadine (CLARITIN) 10 MG tablet Take 10 mg by mouth 3 (three) times daily as needed for allergies.   LORazepam (ATIVAN) 0.5 MG tablet TAKE 1 TABLET BY MOUTH 1 TO 2 TIMES DAILY AS NEEDED FOR ANXIETY   NONFORMULARY OR COMPOUNDED ITEM VoThy(T4) 60mg caps. One capsule daily   predniSONE (DELTASONE) 5 MG tablet TAKE 1 TABLET(5 MG) BY MOUTH DAILY   ranitidine (ZANTAC) 75 MG tablet Take 75 mg by mouth as needed.   No facility-administered medications prior to visit.    Review of Systems  Constitutional: Negative.   Respiratory: Negative.    Cardiovascular: Negative.   Gastrointestinal: Negative.   Neurological:  Negative for dizziness, light-headedness and headaches.  Psychiatric/Behavioral:  Negative for decreased concentration, self-injury, sleep disturbance and suicidal ideas. The patient is nervous/anxious.      Objective    LMP 09/24/2017 (Approximate) Comment: Patient thinks last period was 2 or 3 years ago   Physical Exam   Awake, alert, oriented x 3. In no apparent distress   Assessment & Plan     1. Situational anxiety Doing well, refill LORazepam (ATIVAN) 0.5 MG tablet; TAKE 1 TABLET BY MOUTH 1 TO 2 TIMES DAILY AS NEEDED FOR ANXIETY  Dispense: 60 tablet; Refill: 5  2. Osteoporosis, unspecified osteoporosis type, unspecified pathological fracture presence refill alendronate (FOSAMAX) 70 MG tablet; Take 1 tablet (70 mg total) by mouth once a week. Take with a full glass of water on an empty stomach.  Dispense: 12 tablet; Refill: 4  3. Chronic asthma without complication, unspecified asthma severity, unspecified whether persistent refill albuterol (VENTOLIN HFA) 108 (90 Base) MCG/ACT inhaler; INHALE 2 PUFFS INTO THE LUNGS EVERY 6  HOURS AS NEEDED FOR WHEEZING OR SHORTNESS OF BREATH  Dispense: 8.5 g; Refill: 5  4. History of anaphylaxis refill EPINEPHrine 0.3 mg/0.3 mL IJ SOAJ injection; Inject 0.3 mg into the muscle as needed for anaphylaxis.  Dispense: 2 each; Refill: 4        I discussed the assessment and treatment plan with the patient. The patient was provided an opportunity to ask questions and all were answered. The patient agreed with the plan and demonstrated an understanding of the instructions.   The patient was advised to call back or seek an in-person evaluation if the symptoms worsen or if the condition fails to improve as anticipated.  I provided 12 minutes of non-face-to-face time during this encounter.  The entirety of the information documented in the History of Present Illness, Review of Systems and Physical Exam were personally obtained by me. Portions of this information were initially documented by the CMA and reviewed by me for thoroughness and accuracy.    DLelon Huh MD [phone](phone) [phone](fax)  [place]

## 2021-03-04 NOTE — Progress Notes (Signed)
The patient comes in today with chronic low back pain that does not have any radicular component.  She is someone who has a rheumatologic condition of psoriatic arthritis.  Every time she gets a flare of her psoriasis it has been affecting her back quite a bit.  She also feels this may have been COVID-related.  She is on prednisone chronically and has increased her prednisone dose for short-term when she does have episodes of flares up and this does help things calm down.  She denies any weakness in her legs.  She is of petite stature and does do a lot of standing.  She is worked on some stretching as well.  She denies any change in bowel or bladder function.  She does have good flexion extension of the lumbar spine but is definitely painful facet joints across the lower lumbar spine.  She has 5 out of 5 strength in the bilateral lower extremities and normal sensation as well.  An AP and lateral lumbar spine show a mild degenerative scoliosis.  The disc heights are well-maintained as well as the vertebral bodies.  There is arthritic change in the posterior elements.  I do feel that her pain is related to facet joint arthropathy in the lower lumbar spine.  I have shown her exercises but I will have her try as well as over-the-counter Voltaren gel and methocarbamol.  She will try these exercises twice daily and I will see her back in about 3 weeks.  If things or not improving a MRI of the lumbar spine will be warranted.  All question concerns were answered and addressed.

## 2021-05-10 ENCOUNTER — Ambulatory Visit: Payer: Self-pay | Admitting: *Deleted

## 2021-05-10 NOTE — Telephone Encounter (Signed)
°  Chief Complaint: reaction to weaning medication last night , requesting assistance by PCP Symptoms: none now. Last night reports sneezing, runny nose, lung tightness after taking ativan 0.5 mg  Frequency: happened last month and then again last night  Pertinent Negatives: Patient denies sx now . Disposition: [] ED /[] Urgent Care (no appt availability in office) / [x] Appointment(In office/virtual)/ []  [place] [facility]/ [] Home Care/ [] Refused Recommended Disposition /[]  Mobile Bus/ []  Follow-up with PCP Additional Notes:  Recommended patient go to UC or ED if symptoms of "lung tightness" happen again and inhaler not effective. Patient has been weaning self from Ativan from 1.0 mg to 0.5 mg now . Appt scheduled for  05/14/21. Please advise       Reason for Disposition  [1] Caller has NON-URGENT medicine question about med that PCP prescribed AND [2] triager unable to answer question  Answer Assessment - Initial Assessment Questions 1. NAME of MEDICATION: "What medicine are you calling about?"     Ativan  2. QUESTION: "What is your question?" (e.g., double dose of medicine, side effect)     Would like to have PCP review weaning off medication Ativan   3. PRESCRIBING HCP: "Who prescribed it?" Reason: if prescribed by specialist, call should be referred to that group.     PCP 4. SYMPTOMS: "Do you have any symptoms?"     Yes , sneezing, runny nose chest/ lung tightness asthma  5. SEVERITY: If symptoms are present, ask "Are they mild, moderate or severe?"     No symptoms now  6. PREGNANCY:  "Is there any chance that you are pregnant?" "When was your last menstrual period?"     na  Protocols used: Medication Question Call-A-AH

## 2021-05-14 ENCOUNTER — Encounter: Payer: Self-pay | Admitting: Family Medicine

## 2021-05-14 ENCOUNTER — Ambulatory Visit: Payer: BC Managed Care – PPO | Admitting: Family Medicine

## 2021-05-14 ENCOUNTER — Other Ambulatory Visit: Payer: Self-pay

## 2021-05-14 VITALS — BP 116/78 | HR 88 | Temp 98.5°F | Resp 16 | Ht 65.0 in | Wt 125.0 lb

## 2021-05-14 DIAGNOSIS — Z7952 Long term (current) use of systemic steroids: Secondary | ICD-10-CM | POA: Diagnosis not present

## 2021-05-14 DIAGNOSIS — D8940 Mast cell activation, unspecified: Secondary | ICD-10-CM | POA: Diagnosis not present

## 2021-05-14 DIAGNOSIS — F418 Other specified anxiety disorders: Secondary | ICD-10-CM

## 2021-05-14 DIAGNOSIS — J45909 Unspecified asthma, uncomplicated: Secondary | ICD-10-CM

## 2021-05-14 MED ORDER — PREDNISONE 10 MG PO TABS: ORAL_TABLET | ORAL | 0 refills | Status: DC

## 2021-05-14 MED ORDER — DIAZEPAM 5 MG PO TABS: ORAL_TABLET | ORAL | 0 refills | Status: DC

## 2021-05-14 MED ORDER — DIAZEPAM 2 MG PO TABS: ORAL_TABLET | ORAL | 0 refills | Status: DC

## 2021-05-14 NOTE — Assessment & Plan Note (Signed)
Chronic. With symptoms consistent with current flare, thought to be triggered by ativan, though similar symptoms can also be seen in benzo withdrawal. Followed by allergist Dr. Lloyd Huger at [facility] Halford Chessman, has follow up in March and July, respectively. Agree with ativan wean given anticipation of starting additional sedating medication in March. Will transition ativan to diazepam to aid in potential withdrawal symptoms. Increased prednisone dose in taper to treat MCAS flare. Maintain follow ups with allergist and pulm. F/u in 1 month to follow weaning and symptomatology.  Shared decision making regarding wean plan as follows: Continue current weaned dose of ativan the rest of this week.  On 1/22 - starting 1/2 pill of 57m diazepam x14 days.  On 2/5 - starting 1/4 pill of 5768mdiazepam x14 days On 2/19 - 1/2 pill of 68m79miazepam x14 days On 3/5 - 1/4 pill of 68mg73mazepam x14 days On 3/19 - 1/4 pill of 2 mg diazepam eod for 2 weeks, followed by 1/4 pill of 2 mg diazepam every 3rd day for 2 weeks then stop diazepam.

## 2021-05-14 NOTE — Progress Notes (Signed)
° °  SUBJECTIVE:   CHIEF COMPLAINT / HPI:   Medication reaction - has mast cell activation syndrome and has developed allergies to medications in the past with improvement in transition or discontinuation of supposed inciting medication. - on 12/9 developed tingling tongue, itchy throat, sneezing, sinus drainage, runny nose. Consistent with prior MACS flares, thinks may have developed reaction/allergy to ativan. Had started wean, 1/4 dose per week in anticipation of starting new 1st generation antihistamine for MCAS with allergist, looking to start in March. Wants to wean off ativan first to prevent sedation. - Subsequently developed flu last week with chest tightness. - sees pulm, allergist.  - currently on ativan 3/4 pill daily. Feels symptoms haven't really changed with dose decrease (tongue tingles, itchy throat, sneezing. Delayed sinus drainage, runny nose, sneezing 3-4 hrs later. Low grade fever. Some chest tightness).  - denies seizures - on prednisone 25m daily for maintenance, takes 186mfor flares. Also with symbicort, antihistamine. - COVID negative. - albuterol helped a little with chest tightness. - dad with CABGx4 in 5055sdied in 6076srom prostate cancer. Mom with HTN, died from gallbladder cancer. - previous ECHO in 20s, coronary spasm. Hasnt had since - no chest tightness right now. Took prednisone 1015mnd benadryl last night with improvement. - goes back to see Pulm Dr SooHalford Chessman July. - goes back to see Dr. LugLloyd Huger March  OBJECTIVE:   BP 116/78 (BP Location: Left Arm, Patient Position: Sitting, Cuff Size: Normal)    Pulse 88    Temp 98.5 F (36.9 C) (Temporal)    Resp 16    Ht 5' 5"  (1.651 m)    Wt 125 lb (56.7 kg)    LMP 09/24/2017 (Approximate) Comment: Patient thinks last period was 2 or 3 years ago   SpO2 96%    BMI 20.80 kg/m   Gen: well appearing, in NAD HEENT: oropharynx clear without exudate, erythema. Tms visible bilaterally without bulging, purulence. NonTTP over  frontal and maxillary sinuses. Nose normal. Card: RRR Lungs: CTAB. No wheeze, rales, rhonchi. Ext: WWP, no edema   ASSESSMENT/PLAN:   Mast cell activation syndrome (HCC) Chronic. With symptoms consistent with current flare, thought to be triggered by ativan, though similar symptoms can also be seen in benzo withdrawal. Followed by allergist Dr. LugLloyd Huger [facility]. SooHalford Chessmanas follow up in March and July, respectively. Agree with ativan wean given anticipation of starting additional sedating medication in March. Will transition ativan to diazepam to aid in potential withdrawal symptoms. Increased prednisone dose in taper to treat MCAS flare. Maintain follow ups with allergist and pulm. F/u in 1 month to follow weaning and symptomatology.  Shared decision making regarding wean plan as follows: Continue current weaned dose of ativan the rest of this week.  On 1/22 - starting 1/2 pill of 5mg26mazepam x14 days.  On 2/5 - starting 1/4 pill of 5mg 24mzepam x14 days On 2/19 - 1/2 pill of 2mg d57mepam x14 days On 3/5 - 1/4 pill of 2mg di66mpam x14 days On 3/19 - 1/4 pill of 2 mg diazepam eod for 2 weeks, followed by 1/4 pill of 2 mg diazepam every 3rd day for 2 weeks then stop diazepam.     Dawn [name]

## 2021-06-11 ENCOUNTER — Encounter: Payer: Self-pay | Admitting: Family Medicine

## 2021-06-11 ENCOUNTER — Telehealth (INDEPENDENT_AMBULATORY_CARE_PROVIDER_SITE_OTHER): Payer: BC Managed Care – PPO | Admitting: Family Medicine

## 2021-06-11 DIAGNOSIS — Z91199 Patient's noncompliance with other medical treatment and regimen due to unspecified reason: Secondary | ICD-10-CM

## 2021-06-11 NOTE — Progress Notes (Signed)
Patient did not check in for video visit and did not answer multiple telephone call attempts.

## 2021-06-13 ENCOUNTER — Other Ambulatory Visit: Payer: Self-pay | Admitting: Family Medicine

## 2021-06-13 NOTE — Telephone Encounter (Signed)
[facility] faxed refill request for the following medications:  Coral Calcium-Magnesium-Vit D 200-100-100 MG-MG-UNIT CAPS   Please advise

## 2021-06-13 NOTE — Telephone Encounter (Signed)
Last refill: 06/13/2020 #540 with 3 refills  Last office visit:05/14/2021 seen by Dr. Ky Barban Maryjane Hurter TSH and T4 labs 08/31/2019 Next office visit: no future visit scheduled.

## 2021-06-14 ENCOUNTER — Ambulatory Visit: Payer: BC Managed Care – PPO | Admitting: Family Medicine

## 2021-06-14 MED ORDER — CORAL CALCIUM-MAGNESIUM-VIT D 200-100-100 MG-MG-UNIT PO CAPS: 2.0000 | ORAL_CAPSULE | Freq: Three times a day (TID) | ORAL | 3 refills | Status: DC

## 2021-06-26 ENCOUNTER — Other Ambulatory Visit: Payer: Self-pay | Admitting: Family Medicine

## 2021-06-26 DIAGNOSIS — K50814 Crohn's disease of both small and large intestine with abscess: Secondary | ICD-10-CM

## 2021-06-26 DIAGNOSIS — J45909 Unspecified asthma, uncomplicated: Secondary | ICD-10-CM

## 2021-06-26 NOTE — Telephone Encounter (Signed)
Last refill: 01/27/2021 # 30 with 5 refills ? ?Last office visit : 05/14/2021 with Dr. Ky Barban ?No future office visit ?

## 2021-10-21 ENCOUNTER — Ambulatory Visit: Payer: BC Managed Care – PPO | Admitting: Obstetrics

## 2021-10-21 VITALS — Ht 65.0 in | Wt 133.0 lb

## 2021-10-21 DIAGNOSIS — D4709 Other mast cell neoplasms of uncertain behavior: Secondary | ICD-10-CM | POA: Insufficient documentation

## 2021-10-21 DIAGNOSIS — N841 Polyp of cervix uteri: Secondary | ICD-10-CM

## 2021-10-21 DIAGNOSIS — R87615 Unsatisfactory cytologic smear of cervix: Secondary | ICD-10-CM | POA: Diagnosis not present

## 2021-10-21 NOTE — Progress Notes (Signed)
Obstetrics & Gynecology Office Visit   Chief Complaint:  Chief Complaint  Patient presents with   Follow-up    Pap smear    History of Present Illness: Dawn [name] present for a repeat pap smear. Initially, she told the MA that she had a pap smear recently, and the results were inconclusive, so she was instructed to have it repeated. Upon chart review, it  was discovered that she had her pap smear last year in July.  She does share some anxiety about having the pap smear; and reports that the pap might be painful. She declines an annual Well Woman exam today, and wants the pap smear only.  She is postmenopausal.   Review of Systems:  Review of Systems  Constitutional: Negative.   HENT: Negative.    Eyes: Negative.   Respiratory: Negative.    Cardiovascular: Negative.   Gastrointestinal: Negative.   Genitourinary: Negative.   Musculoskeletal: Negative.   Skin: Negative.   Neurological: Negative.   Endo/Heme/Allergies: Negative.   Psychiatric/Behavioral: Negative.       Past Medical History:  Past Medical History:  Diagnosis Date   Acquired hypothyroidism 12/11/2014   Allergy to pollen 02/23/2006   Anxiety    Asthma    Asthma, chronic 02/26/2014   Onset as teenager    Asthma, exogenous 02/23/2006   Crohn's disease of both small and large intestine with abscess (HCC) 04/28/2005   (Ulcerative Colitis)    Fatigue 12/11/2014   GERD (gastroesophageal reflux disease)    Headache 12/11/2014   History of anaphylaxis 12/11/2014   History of insect sting allergy 02/26/2014   Hymenoptera, carries EpiPen    Hx of colonic polyps    Hx: UTI (urinary tract infection)    Mast cell disease    Osteoporosis due to androgen therapy 02/23/2006   Raynaud's phenomenon 12/11/2014   Rosacea 12/11/2014   Tobacco abuse 04/29/2003   Ulcerative colitis     Past Surgical History:  Past Surgical History:  Procedure Laterality Date   COLONOSCOPY WITH PROPOFOL N/A 09/10/2020   Procedure: COLONOSCOPY  WITH PROPOFOL;  Surgeon: Midge Minium, MD;  Location: [facility];  Service: Endoscopy;  Laterality: N/A;   ELECTROMYOGRAPHY  06/13/15   UE EMG Normal   SHOULDER SURGERY      Gynecologic History: Patient's last menstrual period was 09/24/2017 (approximate).  Obstetric History: No obstetric history on file.  Family History:  Family History  Problem Relation Age of Onset   Cancer Mother        gallbladder   Irritable bowel syndrome Mother    Prostate cancer Father    Heart disease Father    Colon cancer Paternal Grandmother    Renal cancer Maternal Grandmother     Social History:  Social History   Socioeconomic History   Marital status: Married    Spouse name: Not on file   Number of children: 0   Years of education: Not on file   Highest education level: Not on file  Occupational History   Occupation: Inpatient Coder  Tobacco Use   Smoking status: Former    Packs/day: 0.30    Years: 10.00    Total pack years: 3.00    Types: Cigarettes    Quit date: 01/16/1995    Years since quitting: 26.7   Smokeless tobacco: Never  Substance and Sexual Activity   Alcohol use: Yes    Alcohol/week: 0.0 standard drinks of alcohol    Comment: occasional   Drug use: No  Sexual activity: Not on file  Other Topics Concern   Not on file  Social History Narrative   Not on file   Social Determinants of Health   Financial Resource Strain: Not on file  Food Insecurity: Not on file  Transportation Needs: Not on file  Physical Activity: Not on file  Stress: Not on file  Social Connections: Not on file  Intimate Partner Violence: Not on file    Allergies:  Allergies  Allergen Reactions   Codeine    Erythromycin    Other     ANTICHOLINERGIC MEDICATIONS    Oxycodone Other (See Comments)   Oxycodone-Acetaminophen Other (See Comments) and Nausea And Vomiting   Penicillins    Sugar-Protein-Starch    Sulfa Drugs Cross Reactors    Vancomycin     Medications: Prior to  Admission medications   Medication Sig Start Date End Date Taking? Authorizing Provider  albuterol (VENTOLIN HFA) 108 (90 Base) MCG/ACT inhaler INHALE 2 PUFFS INTO THE LUNGS EVERY 6 HOURS AS NEEDED FOR WHEEZING OR SHORTNESS OF BREATH 03/04/21   Malva Limes, MD  alendronate (FOSAMAX) 70 MG tablet Take 1 tablet (70 mg total) by mouth once a week. Take with a full glass of water on an empty stomach. 03/04/21   Malva Limes, MD  budesonide-formoterol [facility]) 160-4.5 MCG/ACT inhaler Inhale 2 puffs into the lungs 2 (two) times daily. 08/17/17   Coralyn Helling, MD  Coral Calcium-Magnesium-Vit D 200-100-100 MG-MG-UNIT CAPS Take 2 each by mouth 3 (three) times daily. 06/14/21   Malva Limes, MD  cromolyn (GASTROCROM) 100 MG/5ML solution Take by mouth. 12/26/20 12/26/21  [provider]  diazepam (VALIUM) 2 MG tablet Take 1/2 pill daily. Patient not taking: Reported on 06/11/2021 05/14/21   Caro Laroche, DO  diazepam (VALIUM) 5 MG tablet Take 1/2 pill daily. Patient not taking: Reported on 06/11/2021 05/14/21   Caro Laroche, DO  EPINEPHrine 0.3 mg/0.3 mL IJ SOAJ injection Inject 0.3 mg into the muscle as needed for anaphylaxis. 03/04/21   Malva Limes, MD  Ferrous Sulfate (IRON SUPPLEMENT PO) Take 1 tablet by mouth daily.    [provider]  glucose blood test strip  04/02/06   [provider]  KETOTIFEN FUMARATE OP Take by mouth.    Gerarda Fraction, MD  loratadine (CLARITIN) 10 MG tablet Take 10 mg by mouth 3 (three) times daily as needed for allergies.    [provider]  LORazepam (ATIVAN) 0.5 MG tablet Take 0.25-0.5 mg by mouth daily.    [provider]  methocarbamol (ROBAXIN) 500 MG tablet Take 1 tablet (500 mg total) by mouth every 6 (six) hours as needed. 03/04/21   Kathryne Hitch, MD  NONFORMULARY OR COMPOUNDED ITEM VoThy(T4) caps. One capsule daily 10/12/20   Malva Limes, MD  predniSONE (DELTASONE) 5 MG tablet TAKE 1  TABLET(5 MG) BY MOUTH DAILY 06/26/21   Malva Limes, MD  ranitidine (ZANTAC) 75 MG tablet Take 75 mg by mouth as needed.    [provider]  montelukast (SINGULAIR) 10 MG tablet Take 1 tablet (10 mg total) by mouth at bedtime. 11/29/18 02/21/19  Ivonne Andrew, NP    Physical Exam Vitals: There were no vitals filed for this visit. Patient's last menstrual period was 09/24/2017 (approximate).  Physical Exam Vitals reviewed.  Constitutional:      Appearance: Normal appearance. She is normal weight.  Pulmonary:     Effort: Pulmonary effort is normal.  Breath sounds: Normal breath sounds.  Abdominal:     General: Abdomen is flat.     Palpations: Abdomen is soft.  Genitourinary:    General: Normal vulva.     Rectum: Normal.     Comments: No external lesions or rashes. Some atrophic changes noted. Speculum exam reveals pale pink vaginal walls. There is a pea sized polyp that is slightly pedunculated, seen comeg from the cervical os. Musculoskeletal:     Cervical back: Normal range of motion and neck supple.  Skin:    General: Skin is warm and dry.  Neurological:     General: No focal deficit present.     Mental Status: She is alert and oriented to person, place, and time.  Psychiatric:        Mood and Affect: Mood normal.        Behavior: Behavior normal.      Assessment: 56 y.o. No obstetric history on file. Hx of inconclusive pap smear- for pap today. Cervical polyp.   Plan: Problem List Items Addressed This Visit       Genitourinary   Cervical polyp   Other Visit Diagnoses     Unsatisfactory cervical Papanicolaou smear    -  Primary     Pap retrieved today and patient tolerated this very well. Will have her RTC for polyp removal with Dr. Marice Potter. I encouraged her to return for a GYN exam.  Mirna Mires, CNM  10/21/2021 5:29 PM

## 2021-10-22 ENCOUNTER — Ambulatory Visit: Payer: Self-pay

## 2021-10-23 ENCOUNTER — Ambulatory Visit: Payer: BC Managed Care – PPO | Admitting: Family Medicine

## 2021-10-23 ENCOUNTER — Encounter: Payer: Self-pay | Admitting: Family Medicine

## 2021-10-23 ENCOUNTER — Other Ambulatory Visit (HOSPITAL_COMMUNITY)
Admission: RE | Admit: 2021-10-23 | Discharge: 2021-10-23 | Disposition: A | Discharge status: Home / Self Care | Payer: BC Managed Care – PPO | Source: Ambulatory Visit | Attending: Obstetrics | Admitting: Obstetrics

## 2021-10-23 VITALS — BP 106/76 | HR 91 | Temp 98.5°F | Resp 16 | Ht 65.0 in | Wt 134.3 lb

## 2021-10-23 DIAGNOSIS — Z1159 Encounter for screening for other viral diseases: Secondary | ICD-10-CM

## 2021-10-23 DIAGNOSIS — E039 Hypothyroidism, unspecified: Secondary | ICD-10-CM

## 2021-10-23 DIAGNOSIS — R87615 Unsatisfactory cytologic smear of cervix: Secondary | ICD-10-CM | POA: Insufficient documentation

## 2021-10-23 DIAGNOSIS — D8940 Mast cell activation, unspecified: Secondary | ICD-10-CM

## 2021-10-23 DIAGNOSIS — Z114 Encounter for screening for human immunodeficiency virus [HIV]: Secondary | ICD-10-CM

## 2021-10-23 DIAGNOSIS — M810 Age-related osteoporosis without current pathological fracture: Secondary | ICD-10-CM | POA: Diagnosis not present

## 2021-10-23 DIAGNOSIS — Z0000 Encounter for general adult medical examination without abnormal findings: Secondary | ICD-10-CM | POA: Diagnosis not present

## 2021-10-23 NOTE — Addendum Note (Signed)
Addended by: Brien Few on: 10/23/2021 10:06 AM   Modules accepted: Orders

## 2021-10-23 NOTE — Patient Instructions (Signed)
Things to do to keep yourself healthy  - Exercise at least 30-45 minutes a day, 3-4 days a week.  - Eat a low-fat diet with lots of fruits and vegetables, up to 7-9 servings per day.  - Seatbelts can save your life. Wear them always.  - Smoke detectors on every level of your home, check batteries every year.  - Eye Doctor - have an eye exam every 1-2 years  - Safe sex - if you may be exposed to STDs, use a condom.  - Alcohol -  If you drink, do it moderately, less than 2 drinks per day.  - [place]. Choose someone to speak for you if you are not able. https://www.prepareforyourcare.org is a great website to help you navigate this. - Depression is common in our stressful world.If you're feeling down or losing interest in things you normally enjoy, please come in for a visit.  - Violence - If anyone is threatening or hurting you, please call immediately.

## 2021-10-23 NOTE — Assessment & Plan Note (Signed)
Continues on ativan wean due to previous reaction. Recommend continuing current dose for additional 1.5 months then 0.163m dose every other day for few months followed by every 3rd day and so on. F/u 6 months.

## 2021-10-23 NOTE — Progress Notes (Signed)
BP 106/76 (BP Location: Left Arm, Patient Position: Sitting, Cuff Size: Normal)   Pulse 91   Temp 98.5 F (36.9 C) (Oral)   Resp 16   Ht 5' 5"  (1.651 m)   Wt 134 lb 4.8 oz (60.9 kg)   LMP 09/24/2017 (Approximate) Comment: Patient thinks last period was 2 or 3 years ago  BMI 22.35 kg/m    Subjective:    Patient ID: Dawn [name], female    DOB: Mar 18, 1966, 56 y.o.   MRN: [id number]  HPI: Dawn [name] is a 56 y.o. female presenting on 10/23/2021 for comprehensive medical examination. Current medical complaints include:none  Mast cell activation syndrome - continues on Ativan weaning, currently on 0.143m for the past 1.5 months. Wanting to know how for further wean.  She currently lives with: husband Menopausal Symptoms: no  Depression Screen done today and results listed below:     10/23/2021   11:14 AM 10/23/2021   11:13 AM 05/14/2021    8:29 AM 03/04/2021    9:39 AM 12/06/2019    8:59 AM  Depression screen PHQ 2/9  Decreased Interest 0 0 0 0 0  Down, Depressed, Hopeless 0 0 0 0 0  PHQ - 2 Score 0 0 0 0 0  Altered sleeping 0  0 0 0  Tired, decreased energy 0  0 0 0  Change in appetite 0  0 0 0  Feeling bad or failure about yourself  0  0 0 0  Trouble concentrating 0  0 0 0  Moving slowly or fidgety/restless 0  0 0 0  Suicidal thoughts 0  0 0 0  PHQ-9 Score 0  0 0 0  Difficult doing work/chores Not difficult at all  Not difficult at all Not difficult at all Not difficult at all    The patient does not have a history of falls. I did not complete a risk assessment for falls. A plan of care for falls was not documented.   Past Medical History:  Past Medical History:  Diagnosis Date   Acquired hypothyroidism 12/11/2014   Allergy to pollen 02/23/2006   Anxiety    Asthma    Asthma, chronic 02/26/2014   Onset as teenager    Asthma, exogenous 02/23/2006   [place]'s [place] of both small and large intestine [place] 04/[place]   (Ulcerative Colitis)    Fatigue  12/11/2014   GERD (gastroesophageal reflux disease)    Headache 12/11/2014   History of anaphylaxis 12/11/2014   History of insect sting allergy 02/26/2014   Hymenoptera, carries EpiPen    Hx of colonic polyps    Hx: UTI (urinary tract infection)    Mast cell disease    Osteoporosis due to androgen therapy 02/23/2006   Raynaud's phenomenon 12/11/2014   Rosacea 12/11/2014   Tobacco abuse 04/29/2003   Ulcerative colitis     Surgical History:  Past Surgical History:  Procedure Laterality Date   COLONOSCOPY WITH PROPOFOL N/A 09/10/2020   Procedure: COLONOSCOPY WITH PROPOFOL;  Surgeon: WLucilla Lame MD;  [facility]  Service: Endoscopy;  Laterality: N/A;   ELECTROMYOGRAPHY  06/13/15   UE EMG Normal   SHOULDER SURGERY      Medications:  Current Outpatient Medications on File Prior to Visit  Medication Sig   albuterol (VENTOLIN HFA) 108 (90 Base) MCG/ACT inhaler INHALE 2 PUFFS INTO THE LUNGS EVERY 6 HOURS AS NEEDED FOR WHEEZING OR SHORTNESS OF BREATH   alendronate (FOSAMAX) 70 MG tablet Take 1 tablet (  70 mg total) by mouth once a week. Take with a full glass of water on an empty stomach.   budesonide-formoterol (SYMBICORT) 160-4.5 MCG/ACT inhaler Inhale 2 puffs into the lungs 2 (two) times daily.   Coral Calcium-Magnesium-Vit D 200-100-100 MG-MG-UNIT CAPS Take 2 each by mouth 3 (three) times daily.   cromolyn (GASTROCROM) 100 MG/5ML solution Take by mouth.   EPINEPHrine 0.3 mg/0.3 mL IJ SOAJ injection Inject 0.3 mg into the muscle as needed for anaphylaxis.   Ferrous Sulfate (IRON SUPPLEMENT PO) Take 1 tablet by mouth daily.   glucose blood test strip    KETOTIFEN FUMARATE OP Take by mouth.   loratadine (CLARITIN) 10 MG tablet Take 10 mg by mouth 3 (three) times daily as needed for allergies.   LORazepam (ATIVAN) 0.5 MG tablet Take 0.25-0.5 mg by mouth daily.   NONFORMULARY OR COMPOUNDED ITEM VoThy(T4) 42mg caps. One capsule daily   predniSONE (DELTASONE) 5 MG tablet TAKE  1 TABLET(5 MG) BY MOUTH DAILY   ranitidine (ZANTAC) 75 MG tablet Take 75 mg by mouth as needed.   [DISCONTINUED] montelukast (SINGULAIR) 10 MG tablet Take 1 tablet (10 mg total) by mouth at bedtime.   No current facility-administered medications on file prior to visit.    Allergies:  Allergies  Allergen Reactions   Codeine    Erythromycin    Other     ANTICHOLINERGIC MEDICATIONS    Oxycodone Other (See Comments)   Oxycodone-Acetaminophen Other (See Comments) and Nausea And Vomiting   Penicillins    Sugar-Protein-Starch    Sulfa Drugs Cross Reactors    Vancomycin     Social History:  Social History   Socioeconomic History   Marital status: Married    Spouse name: Not on file   Number of children: 0   Years of education: Not on file   Highest education level: Not on file  Occupational History   Occupation: Inpatient Coder  Tobacco Use   Smoking status: Former    Packs/day: 0.30    Years: 10.00    Total pack years: 3.00    Types: Cigarettes    Quit date: 01/16/1995    Years since quitting: 26.7   Smokeless tobacco: Never  Substance and Sexual Activity   Alcohol use: Yes    Alcohol/week: 0.0 standard drinks of alcohol    Comment: occasional   Drug use: No   Sexual activity: Not on file  Other Topics Concern   Not on file  Social History Narrative   Not on file   Social Determinants of Health   Financial Resource Strain: Not on file  Food Insecurity: Not on file  Transportation Needs: Not on file  Physical Activity: Not on file  Stress: Not on file  Social Connections: Not on file  Intimate Partner Violence: Not on file   Social History   Tobacco Use  Smoking Status Former   Packs/day: 0.30   Years: 10.00   Total pack years: 3.00   Types: Cigarettes   Quit date: 01/16/1995   Years since quitting: 26.7  Smokeless Tobacco Never   Social History   Substance and Sexual Activity  Alcohol Use Yes   Alcohol/week: 0.0 standard drinks of alcohol    Comment: occasional    Family History:  Family History  Problem Relation Age of Onset   Cancer Mother        gallbladder   Irritable bowel syndrome Mother    Prostate cancer Father    Heart disease Father  Colon cancer Paternal Grandmother    Renal cancer Maternal Grandmother     Past medical history, surgical history, medications, allergies, family history and social history reviewed with patient today and changes made to appropriate areas of the chart.      Objective:    BP 106/76 (BP Location: Left Arm, Patient Position: Sitting, Cuff Size: Normal)   Pulse 91   Temp 98.5 F (36.9 C) (Oral)   Resp 16   Ht 5' 5"  (1.651 m)   Wt 134 lb 4.8 oz (60.9 kg)   LMP 09/24/2017 (Approximate) Comment: Patient thinks last period was 2 or 3 years ago  BMI 22.35 kg/m   Wt Readings from Last 3 Encounters:  10/23/21 134 lb 4.8 oz (60.9 kg)  10/21/21 133 lb (60.3 kg)  05/14/21 125 lb (56.7 kg)    Physical Exam Vitals reviewed.  Constitutional:      Appearance: Normal appearance.  HENT:     Head: Normocephalic.     Right Ear: External ear normal. There is impacted cerumen.     Left Ear: External ear normal. There is impacted cerumen.     Nose: Nose normal.     Mouth/Throat:     Mouth: Mucous membranes are moist.     Pharynx: Oropharynx is clear.  Eyes:     Extraocular Movements: Extraocular movements intact.     Pupils: Pupils are equal, round, and reactive to light.  Cardiovascular:     Rate and Rhythm: Normal rate and regular rhythm.     Heart sounds: Normal heart sounds. No murmur heard. Pulmonary:     Effort: Pulmonary effort is normal. No respiratory distress.     Breath sounds: Normal breath sounds.  Abdominal:     General: Bowel sounds are normal.     Palpations: Abdomen is soft.     Tenderness: There is no abdominal tenderness.  Musculoskeletal:        General: Normal range of motion.     Right lower leg: No edema.     Left lower leg: No edema.   Lymphadenopathy:     Cervical: No cervical adenopathy.  Skin:    General: Skin is warm and dry.  Neurological:     Mental Status: She is alert and oriented to person, place, and time. Mental status is at baseline.  Psychiatric:        Mood and Affect: Mood normal.        Behavior: Behavior normal.     Results for orders placed or performed in visit on 10/31/20  Cytology - PAP  Result Value Ref Range   High risk HPV Other    Adequacy      UNSATISFACTORY for evaluation due to obscuring inflammation. The   Adequacy      specimen is processed and examined microscopically, but is found to be   Adequacy      unsatisfactory for evaluation of an epithelial abnormality. Repeat study   Adequacy recommended.    Diagnosis - Non-diagnostic (A)    Molecular Comment      Quantity Not Sufficient. Insufficient material to adequately perform   Molecular Comment test.    Comment Normal Reference Range HPV - Negative       Assessment & Plan:   Problem List Items Addressed This Visit       Endocrine   Acquired hypothyroidism   Relevant Orders   Thyroid Panel With TSH     Musculoskeletal and Integument   Osteoporosis   Relevant  Orders   DG Bone Density     Other   Mast cell activation syndrome ([place])    Continues on ativan wean due to previous reaction. Recommend continuing current dose for additional 1.5 months then 0.168m dose every other day for few months followed by every 3rd day and so on. F/u 6 months.       Other Visit Diagnoses     Annual physical exam    -  Primary   Relevant Orders   Comprehensive metabolic panel   Lipid panel   CBC with Differential   Hemoglobin A1c   Need for hepatitis C screening test       Relevant Orders   Hepatitis C antibody   Screening for HIV (human immunodeficiency virus)       Relevant Orders   HIV antibody (with reflex)        Follow up plan: Return in about 6 months (around 04/24/2022).   LABORATORY TESTING:  - Pap smear:  pap done yesterday with GYN.  IMMUNIZATIONS:   - Tdap: Tetanus vaccination status reviewed: refused. - Influenza: Postponed to flu season - Pneumovax: Refused - Prevnar: Refused - HPV: Not applicable - Shingrix vaccine: Refused - COVID vaccine: has received 1 dose of J&J vaccine.  SCREENING: - Mammogram:  gets done with GYN.   - Colonoscopy: Up to date  - Bone Density: Ordered today  - Lung Cancer Screening: Not applicable   Hep C Screening: ordered today STD testing and prevention (HIV/chl/gon/syphilis): no concerns Sexual History : Menstrual History/LMP/Abnormal Bleeding: post-menopausal.  Incontinence Symptoms:   Osteoporosis: Discussed high calcium and vitamin D supplementation, weight bearing exercises  Advanced Care Planning: A voluntary discussion about advance care planning including the explanation and discussion of advance directives.  Discussed health care proxy and Living will, and the patient was able to identify a health care proxy as husband, MRisa Grill  Patient does not have a living will at present time. If patient does have living will, I have requested they bring this to the clinic to be scanned in to their chart.  PATIENT COUNSELING:   Advised to take 1 mg of folate supplement per day if capable of pregnancy.   Sexuality: Discussed sexually transmitted diseases, partner selection, use of condoms, avoidance of unintended pregnancy  and contraceptive alternatives.   Advised to avoid cigarette smoking.  I discussed with the patient that most people either abstain from alcohol or drink within safe limits (<=14/week and <=4 drinks/occasion for males, <=7/weeks and <= 3 drinks/occasion for females) and that the risk for alcohol disorders and other health effects rises proportionally with the number of drinks per week and how often a drinker exceeds daily limits.  Discussed cessation/primary prevention of drug use and availability of treatment for abuse.    Diet: Encouraged to adjust caloric intake to maintain  or achieve ideal body weight, to reduce intake of dietary saturated fat and total fat, to limit sodium intake by avoiding high sodium foods and not adding table salt, and to maintain adequate dietary potassium and calcium preferably from fresh fruits, vegetables, and low-fat dairy products.    Stressed the importance of regular exercise.  Injury prevention: Discussed safety belts, safety helmets, smoke detector, smoking near bedding or upholstery.   Dental health: Discussed importance of regular tooth brushing, flossing, and dental visits.    NEXT PREVENTATIVE PHYSICAL DUE IN 1 YEAR. Return in about 6 months (around 04/24/2022).

## 2021-10-24 ENCOUNTER — Encounter: Payer: Self-pay | Admitting: Obstetrics

## 2021-10-24 LAB — CBC WITH DIFFERENTIAL/PLATELET
Basophils Absolute: 0.1 10*3/uL (ref 0.0–0.2)
Basos: 1 %
EOS (ABSOLUTE): 0.1 10*3/uL (ref 0.0–0.4)
Eos: 1 %
Hematocrit: 39.6 % (ref 34.0–46.6)
Hemoglobin: 13.5 g/dL (ref 11.1–15.9)
Immature Grans (Abs): 0 10*3/uL (ref 0.0–0.1)
Immature Granulocytes: 0 %
Lymphocytes Absolute: 1.9 10*3/uL (ref 0.7–3.1)
Lymphs: 22 %
MCH: 29.7 pg (ref 26.6–33.0)
MCHC: 34.1 g/dL (ref 31.5–35.7)
MCV: 87 fL (ref 79–97)
Monocytes Absolute: 0.3 10*3/uL (ref 0.1–0.9)
Monocytes: 4 %
Neutrophils Absolute: 6 10*3/uL (ref 1.4–7.0)
Neutrophils: 72 %
Platelets: 252 10*3/uL (ref 150–450)
RBC: 4.55 x10E6/uL (ref 3.77–5.28)
RDW: 12.3 % (ref 11.7–15.4)
WBC: 8.4 10*3/uL (ref 3.4–10.8)

## 2021-10-24 LAB — COMPREHENSIVE METABOLIC PANEL
ALT: 35 IU/L — ABNORMAL HIGH (ref 0–32)
AST: 33 IU/L (ref 0–40)
Albumin/Globulin Ratio: 2.2 (ref 1.2–2.2)
Albumin: 4.3 g/dL (ref 3.8–4.9)
Alkaline Phosphatase: 59 IU/L (ref 44–121)
BUN/Creatinine Ratio: 16 (ref 9–23)
BUN: 12 mg/dL (ref 6–24)
Bilirubin Total: 0.9 mg/dL (ref 0.0–1.2)
CO2: 25 mmol/L (ref 20–29)
Calcium: 9.3 mg/dL (ref 8.7–10.2)
Chloride: 102 mmol/L (ref 96–106)
Creatinine, Ser: 0.73 mg/dL (ref 0.57–1.00)
Globulin, Total: 2 g/dL (ref 1.5–4.5)
Glucose: 88 mg/dL (ref 70–99)
Potassium: 4.1 mmol/L (ref 3.5–5.2)
Sodium: 143 mmol/L (ref 134–144)
Total Protein: 6.3 g/dL (ref 6.0–8.5)
eGFR: 95 mL/min/{1.73_m2} (ref >59)

## 2021-10-24 LAB — THYROID PANEL WITH TSH
Free Thyroxine Index: 1.9 (ref 1.2–4.9)
T3 Uptake Ratio: 25 % (ref 24–39)
T4, Total: 7.6 ug/dL (ref 4.5–12.0)
TSH: 6.45 u[IU]/mL — ABNORMAL HIGH (ref 0.450–4.500)

## 2021-10-24 LAB — HIV ANTIBODY (ROUTINE TESTING W REFLEX): HIV Screen 4th Generation wRfx: NONREACTIVE

## 2021-10-24 LAB — LIPID PANEL
Chol/HDL Ratio: 2.4 ratio (ref 0.0–4.4)
Cholesterol, Total: 223 mg/dL — ABNORMAL HIGH (ref 100–199)
HDL: 92 mg/dL (ref >39)
LDL Chol Calc (NIH): 119 mg/dL — ABNORMAL HIGH (ref 0–99)
Triglycerides: 72 mg/dL (ref 0–149)
VLDL Cholesterol Cal: 12 mg/dL (ref 5–40)

## 2021-10-24 LAB — HEMOGLOBIN A1C
Est. average glucose Bld gHb Est-mCnc: 111 mg/dL
Hgb A1c MFr Bld: 5.5 % (ref 4.8–5.6)

## 2021-10-24 LAB — HEPATITIS C ANTIBODY: Hep C Virus Ab: NONREACTIVE

## 2021-10-24 LAB — CYTOLOGY - PAP: Diagnosis: NEGATIVE

## 2021-10-31 ENCOUNTER — Encounter: Payer: Self-pay | Admitting: Obstetrics & Gynecology

## 2021-10-31 ENCOUNTER — Ambulatory Visit: Payer: BC Managed Care – PPO | Admitting: Obstetrics & Gynecology

## 2021-10-31 ENCOUNTER — Other Ambulatory Visit (HOSPITAL_COMMUNITY)
Admission: RE | Admit: 2021-10-31 | Discharge: 2021-10-31 | Disposition: A | Discharge status: Home / Self Care | Payer: BC Managed Care – PPO | Source: Ambulatory Visit | Attending: Obstetrics & Gynecology | Admitting: Obstetrics & Gynecology

## 2021-10-31 VITALS — BP 96/60 | Ht 65.0 in | Wt 134.0 lb

## 2021-10-31 DIAGNOSIS — N841 Polyp of cervix uteri: Secondary | ICD-10-CM | POA: Diagnosis not present

## 2021-10-31 NOTE — Progress Notes (Signed)
   Subjective:    Patient ID: Dawn [name], female    DOB: [birth year], 56 y.o.   MRN: [id number]  HPI  56 yo single G0 here to have a cervical polyp removed. This was noted at the time of her pap smear 09/2021.  Review of Systems She works remotely. She is abstinent.     Objective:   Physical Exam Well nourished, well hydrated White female, no apparent distress She is ambulating and conversing normally. Genitourinary:             External: Normal external female genitalia.  Normal urethral meatus, normal Bartholin's and Skene's glands.               Vagina: Normal vaginal mucosa, no evidence of prolapse.               Cervix: Grossly normal in appearance, with the exception of a pea size cystic type cervical polyp. I prepped the area with Hurricaine spray and then prepped the area with Hibiclens (due to her "possible" shellfish allergy). I grasped the polyp with a ring forceps and twisted. The cystic portion opened. I then regrasped the base and twisted. It would not separate, so I used long Mayo scissors to excise it. Silver nitrate was used to achieve hemostasis. She tolerated the procedure well.                     Assessment & Plan:   Cervical polyp- reassurance given Await pathology

## 2021-11-04 ENCOUNTER — Encounter: Payer: Self-pay | Admitting: Obstetrics & Gynecology

## 2021-11-04 LAB — SURGICAL PATHOLOGY

## 2021-11-05 IMAGING — CT CT CHEST W/O CM
1 series · 15 of 34 positions shown, 19 images · non-contrast
Comparison: 09/29/2017 chest CT.

CLINICAL DATA: Follow-up pulmonary nodules.

EXAM:
CT CHEST WITHOUT CONTRAST
TECHNIQUE: Multidetector CT imaging of the chest was performed following the
standard protocol without IV contrast.

[Series 2: thorax · axial · 0.62mm/px · z∈[-661,-385]mm · 15 of 163 slices shown, 19 images]
[im 13/163  mediastinal]
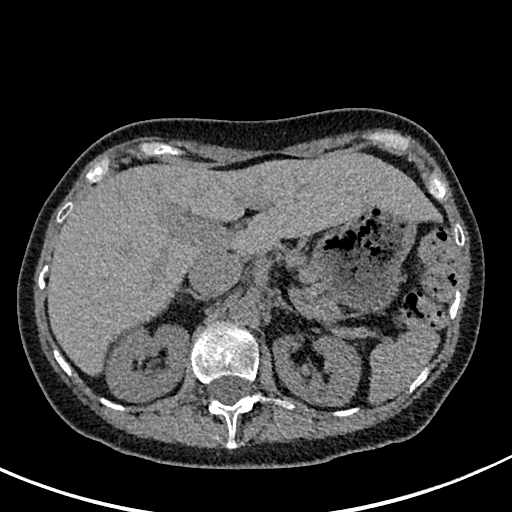
[im 13/163  lung]
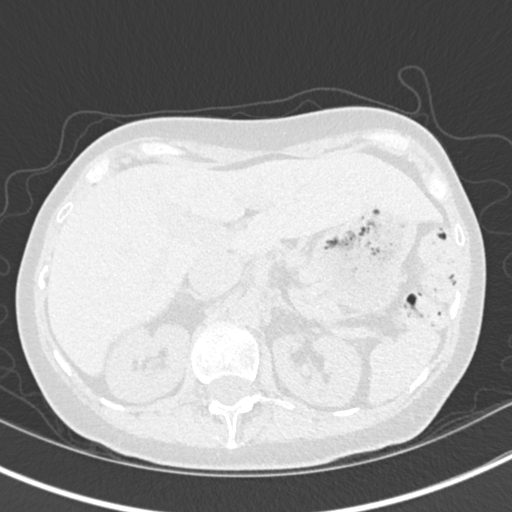
[im 25/163  lung]
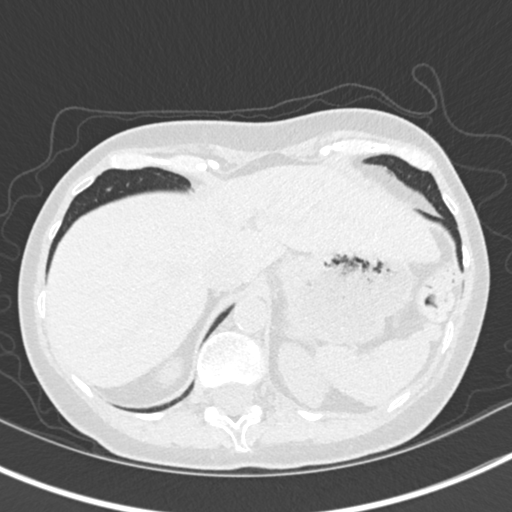
[im 33/163  lung]
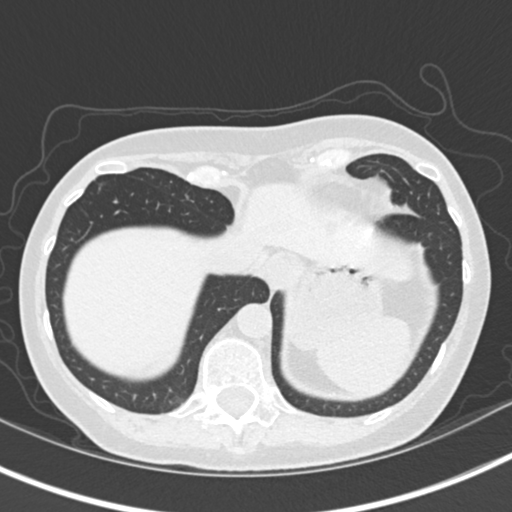
[im 43/163  lung]
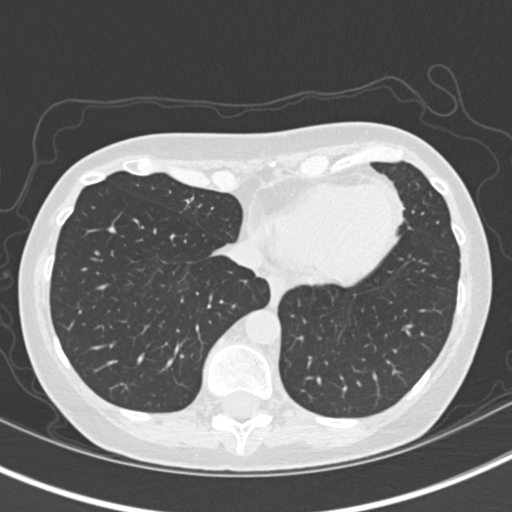
[im 55/163  mediastinal]
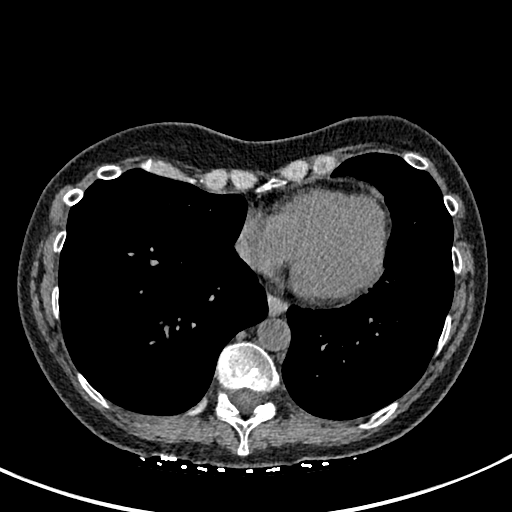
[im 55/163  lung]
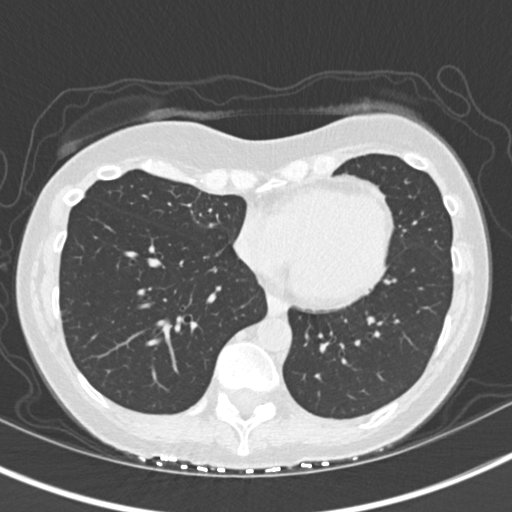
[im 65/163  lung]
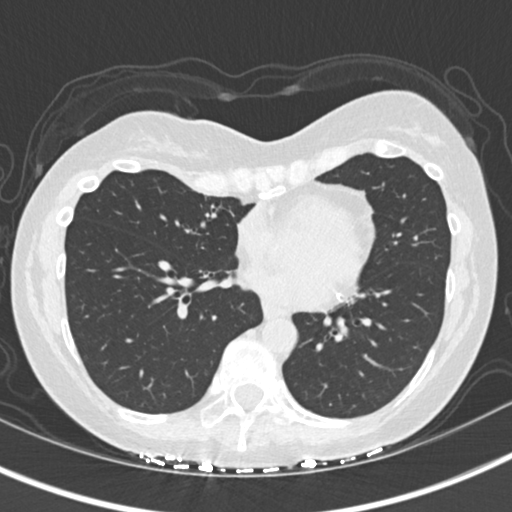
[im 73/163  lung]
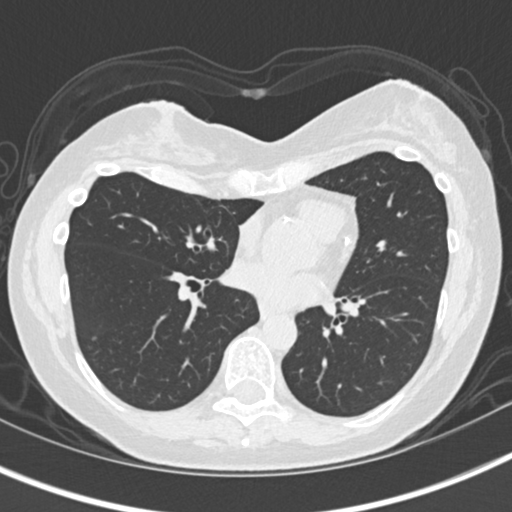
[im 85/163  lung]
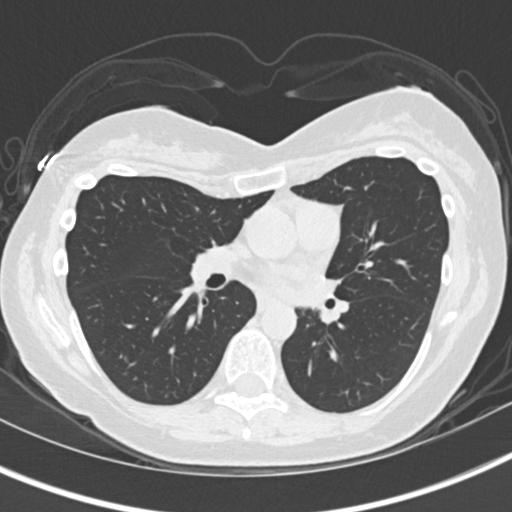
[im 91/163  mediastinal]
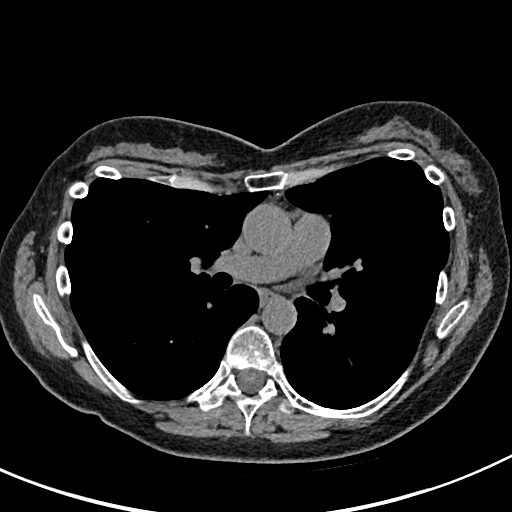
[im 91/163  lung]
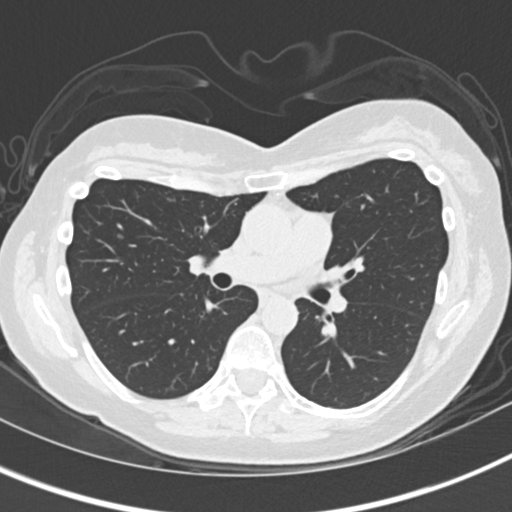
[im 98/163  lung]
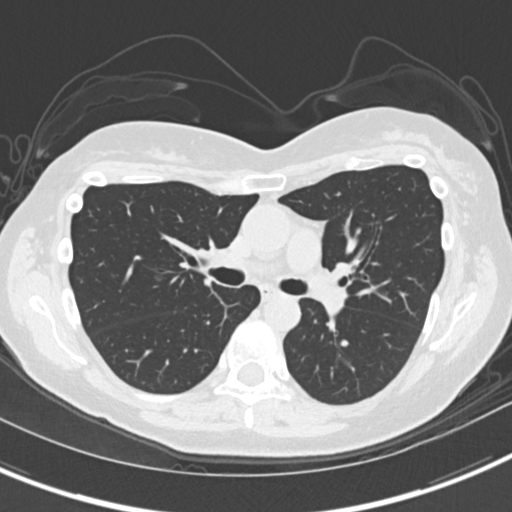
[im 109/163  lung]
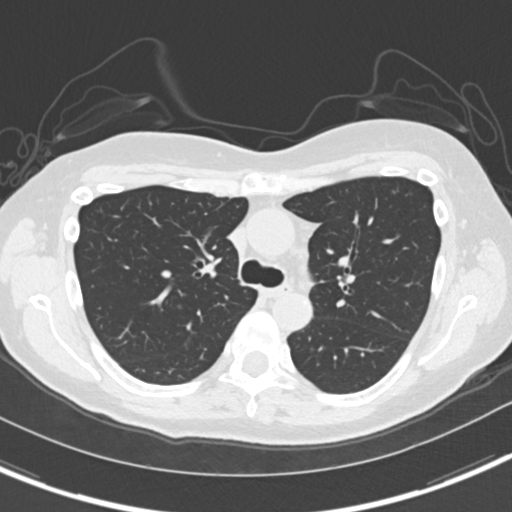
[im 121/163  lung]
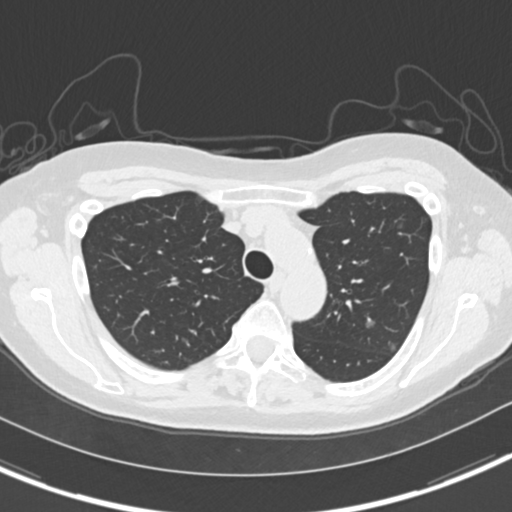
[im 130/163  mediastinal]
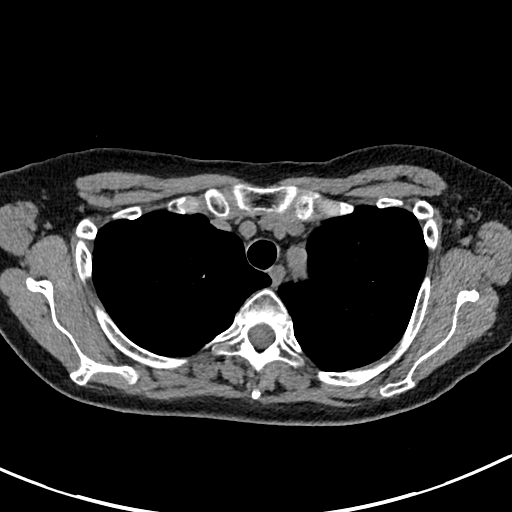
[im 130/163  lung]
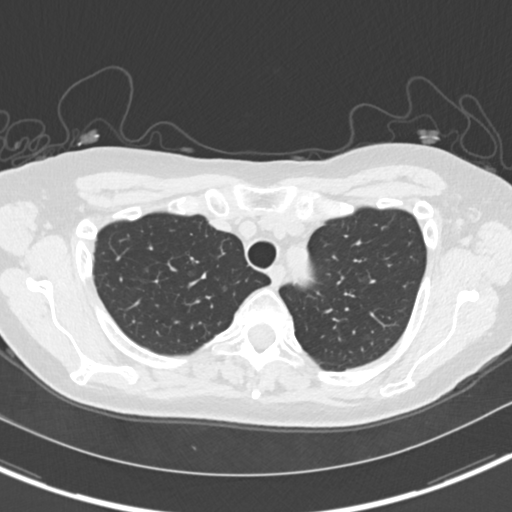
[im 139/163  lung]
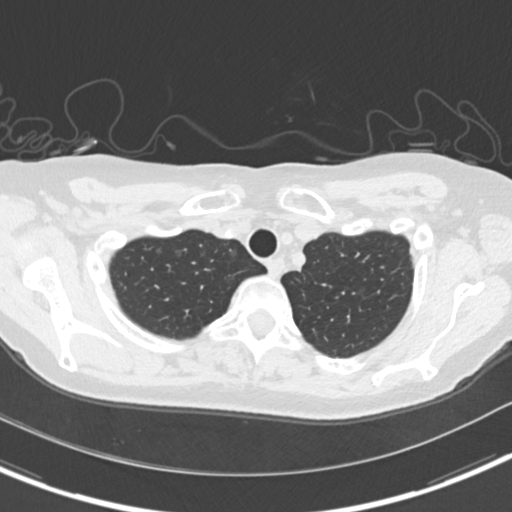
[im 151/163  lung]
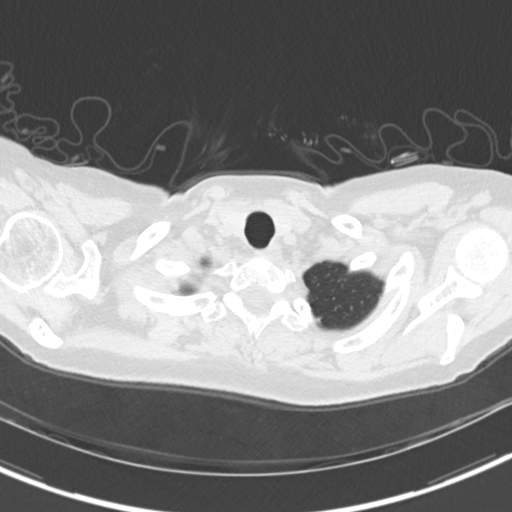

[15 of 34 positions shown; findings below may reference images not displayed]

FINDINGS: Cardiovascular: Normal heart size. Chronic trace pericardial
effusion/thickening, unchanged. Three-vessel coronary
atherosclerosis. Atherosclerotic nonaneurysmal thoracic aorta.
Normal caliber pulmonary arteries.

Mediastinum/Nodes: No discrete thyroid nodules. Unremarkable
esophagus. No pathologically enlarged axillary, mediastinal or hilar
lymph nodes, noting limited sensitivity for the detection of hilar
adenopathy on this noncontrast study.

Lungs/Pleura: No pneumothorax. No pleural effusion. No acute
consolidative airspace disease or lung masses. Numerous (greater
than 15) small ground-glass pulmonary nodules scattered in both
lungs predominantly in the upper lobes, not appreciably changed.
Representative 0.8 cm anterior right upper lobe ground-glass nodule
(series 3/image 40), previously 0.8 cm on 09/29/2017 chest CT using
similar measurement technique, stable. Posterior left upper lobe
cm ground-glass nodule (series 3/image 42), previously 0.7 cm using
similar measurement technique, stable. No appreciable new or
enlarging pulmonary nodules.

Upper abdomen: Subcentimeter hypodense left liver lesions are too
small to characterize and appear unchanged, considered benign.

Musculoskeletal: No aggressive appearing focal osseous lesions. Mild
superior T12 vertebral compression fracture, new since 09/29/2017
CT.
IMPRESSION: 1. Numerous subcentimeter ground-glass pulmonary nodules scattered
in both lungs, predominantly in the upper lobes, not appreciably
changed since 09/29/2017 chest CT using similar measurement
technique. Final follow-up noncontrast chest CT recommended in 2
years. This recommendation follows the consensus statement:
Guidelines for Management of Incidental Pulmonary Nodules Detected
[DATE].
2. Mild superior T12 vertebral compression fracture, of uncertain
chronicity, new since 09/29/2017 CT.
3. Three-vessel coronary atherosclerosis.
4. Aortic Atherosclerosis ([id number]-[id number].E).

## 2021-11-18 ENCOUNTER — Telehealth: Payer: Self-pay | Admitting: Family Medicine

## 2021-11-19 NOTE — Telephone Encounter (Signed)
Called the pharmacy to ok medication to be compounded

## 2021-11-19 NOTE — Telephone Encounter (Signed)
Pt called in to follow up. Pt says that this Rx has always been compound. Pt would like to make sure that this Rx is updated and sent in as soon as possible.    Please assist further.

## 2021-11-19 NOTE — Telephone Encounter (Signed)
Dawn [name] from [facility] drug, states they prescription of tablet for levothyroxine (SYNTHROID) 88 MCG tablet, and they have been compounding this for quite a while, per patients request, and she wants to know should they switch to tablet or continue cmpounding?

## 2021-11-21 ENCOUNTER — Other Ambulatory Visit: Payer: BC Managed Care – PPO

## 2021-11-21 ENCOUNTER — Ambulatory Visit: Payer: Self-pay

## 2021-11-21 NOTE — Telephone Encounter (Signed)
Pt reports that the medication is compounded because she is allergic to lactose, she has taken this compounded for 15 years she says. Will be completely out tomorrow and is upset that she has been calling about this since Monday she says.   She is requesting a call back from [facility] or clinic, she has questions

## 2021-11-21 NOTE — Telephone Encounter (Signed)
Spoke with the patient and advised her the pharmacy was giving a verbal on 7/25.  Explained to the patient we are currently following back up with them to see if they need anything else.    Pt agreed to wait 15-20 mins and call to check pharmacy again.

## 2021-11-21 NOTE — Telephone Encounter (Signed)
This was taken care while patient was with Ruston on the phone and I was with the pharmacist.

## 2021-11-21 NOTE — Telephone Encounter (Signed)
  Chief Complaint: Needs medication Symptoms:  Frequency:  Pertinent Negatives: Patient denies  Disposition: [] ED /[] Urgent Care (no appt availability in office) / [] Appointment(In office/virtual)/ []  [facility]/ [] Home Care/ [] Refused Recommended Disposition /[] [facility]/ [x]  Follow-up with PCP Additional Notes: Pt needs her medication levothyroxine 88. Medication needs to be compounded at the pharmacy d/t allergies to lactose. Medication was called in, but not as a compounded medication. Rx needs to be updated.   Reason for Disposition  [1] Prescription refill request for ESSENTIAL medicine (i.e., likelihood of harm to patient if not taken) AND [2] triager unable to refill per department policy  Answer Assessment - Initial Assessment Questions 1. DRUG NAME: "What medicine do you need to have refilled?"     Levothyroxine 88 2. REFILLS REMAINING: "How many refills are remaining?" (Note: The label on the medicine or pill bottle will show how many refills are remaining. If there are no refills remaining, then a renewal may be needed.)     0 3. EXPIRATION DATE: "What is the expiration date?" (Note: The label states when the prescription will expire, and thus can no longer be refilled.)     0 4. PRESCRIBING HCP: "Who prescribed it?" Reason: If prescribed by specialist, call should be referred to that group.     [facility] 5. SYMPTOMS: "Do you have any symptoms?"      6. PREGNANCY: "Is there any chance that you are pregnant?" "When was your last menstrual period?"     na  Protocols used: Medication Refill and Renewal Call-A-AH

## 2021-11-21 NOTE — Telephone Encounter (Signed)
Pt called reporting that she is still without her medication, she says the compounding lab is waiting on the office to approve her medication. Pt called during lunch.

## 2021-12-17 ENCOUNTER — Telehealth: Payer: Self-pay | Admitting: Family Medicine

## 2021-12-17 DIAGNOSIS — K50814 Crohn's disease of both small and large intestine with abscess: Secondary | ICD-10-CM

## 2021-12-17 DIAGNOSIS — J45909 Unspecified asthma, uncomplicated: Secondary | ICD-10-CM

## 2021-12-18 ENCOUNTER — Telehealth: Payer: Self-pay | Admitting: Family Medicine

## 2021-12-18 ENCOUNTER — Other Ambulatory Visit: Payer: Self-pay | Admitting: Family Medicine

## 2021-12-18 NOTE — Telephone Encounter (Signed)
A user error has taken place: medication ordered in error, not dispensed to this patient.

## 2021-12-18 NOTE — Telephone Encounter (Signed)
Monitoring bone density is required when taking daily prednisone. Cannot refill this without getting BMD, which is why it was ordered back in June. She can try [facility] or [facility], but I'm not refilling until this is scheduled.

## 2021-12-18 NOTE — Telephone Encounter (Addendum)
Patient states her insurance will run out at the end of the month. Patient states the next available bone density is not until next month, patient has been calling every day to see if there is any cancellations.    Patient states she can not just stop taking the predniSONE (DELTASONE) 5 MG tablet and would like PCP to send several refills.    Patient states predniSONE (DELTASONE) 5 MG tablet is a maintenance drug and she is concerned when her insurance runs out by the end of the month she may not be able to afford it .    Patient would like a follow up call

## 2021-12-19 MED ORDER — PREDNISONE 5 MG PO TABS: 5.0000 mg | ORAL_TABLET | Freq: Every day | ORAL | 0 refills | Status: DC

## 2021-12-19 NOTE — Addendum Note (Signed)
Addended by: Birdie Sons on: 12/19/2021 09:49 AM   Modules accepted: Orders

## 2021-12-19 NOTE — Telephone Encounter (Signed)
Patient just called back and said she was able to get an appointment for 01/09/22.  She will run out of medicine this weekend.  She is asking if you can send in Rx until she has the BMD

## 2022-01-09 ENCOUNTER — Other Ambulatory Visit: Payer: BC Managed Care – PPO

## 2022-01-16 ENCOUNTER — Ambulatory Visit
Admission: RE | Admit: 2022-01-16 | Discharge: 2022-01-16 | Disposition: A | Discharge status: Home / Self Care | Payer: Self-pay | Source: Ambulatory Visit | Attending: Family Medicine | Admitting: Family Medicine

## 2022-01-16 ENCOUNTER — Telehealth: Payer: Self-pay | Admitting: Family Medicine

## 2022-01-16 DIAGNOSIS — M810 Age-related osteoporosis without current pathological fracture: Secondary | ICD-10-CM | POA: Insufficient documentation

## 2022-01-16 DIAGNOSIS — J45909 Unspecified asthma, uncomplicated: Secondary | ICD-10-CM

## 2022-01-16 DIAGNOSIS — K50814 Crohn's disease of both small and large intestine with abscess: Secondary | ICD-10-CM

## 2022-01-16 MED ORDER — PREDNISONE 5 MG PO TABS: 5.0000 mg | ORAL_TABLET | Freq: Every day | ORAL | 0 refills | Status: DC

## 2022-01-16 NOTE — Telephone Encounter (Signed)
Pt stated that she does not have any of the predniSONE (DELTASONE) 5 MG tablet because it was being held until she had the bone density test done. Pt reports that she had the bone density test done this morning and she would like the Rx refill approved and sent to [facility], [address] AT [place] Phone: [phone]  Fax: [phone].

## 2022-01-16 NOTE — Telephone Encounter (Signed)
Patient had done density test completed, requesting prednisone refill. Medication was being held until scan was complete.

## 2022-01-16 NOTE — Telephone Encounter (Signed)
Pt calling stated only 21 tablets of her medication predniSONE (DELTASONE) 5 MG tablet were sent in and patient wants to know why.   Pt is requesting a call back today.

## 2022-01-17 ENCOUNTER — Other Ambulatory Visit: Payer: Self-pay

## 2022-01-17 DIAGNOSIS — K50814 Crohn's disease of both small and large intestine with abscess: Secondary | ICD-10-CM

## 2022-01-17 DIAGNOSIS — J45909 Unspecified asthma, uncomplicated: Secondary | ICD-10-CM

## 2022-01-17 NOTE — Telephone Encounter (Signed)
Patient advised of results and verbalized understanding. Patient states that she called yesterday requesting a refill on her Prednisone. She was only given a 3 week supply. She states she takes this daily as a maintenance medication. Patient would like a full prescription send to [facility] pharmacy with additional refills.

## 2022-01-17 NOTE — Telephone Encounter (Signed)
-----   Message from Myles Gip, DO sent at 01/16/2022  3:38 PM EDT ----- Bone density test continues to show osteoporosis with slight worsening from prior study 3 years ago. Continue fosamax. Recommend repeating in 1-2 years, if remains worsened at that time, recommend Endocrinology evaluation but would defer to PCP.

## 2022-01-18 MED ORDER — PREDNISONE 5 MG PO TABS: 5.0000 mg | ORAL_TABLET | Freq: Every day | ORAL | 5 refills | Status: DC

## 2022-03-19 ENCOUNTER — Other Ambulatory Visit: Payer: Self-pay | Admitting: Family Medicine

## 2022-03-19 DIAGNOSIS — E039 Hypothyroidism, unspecified: Secondary | ICD-10-CM

## 2022-03-19 NOTE — Telephone Encounter (Signed)
Katharine Look from [facility] stated Pt needs her medication, levothyroxine 88. Medication needs to be compounded.  The medication levothyroxine (SYNTHROID) 88 MCG tablet was called in, but not as a compounded medication. Katharine Look stated she needs verbal or new Rx.  Please review.

## 2022-03-19 NOTE — Telephone Encounter (Signed)
Requested Prescriptions  Pending Prescriptions Disp Refills   levothyroxine (SYNTHROID) 88 MCG tablet [Pharmacy Med Name: LEVOTHY(T4) 88MCG CAPS] 30 tablet 2    Sig: TAKE (1) CAPSULE BY MOUTH EVERY DAY     Endocrinology:  Hypothyroid Agents Failed - 03/19/2022  9:20 AM      Failed - TSH in normal range and within 360 days    TSH  Date Value Ref Range Status  10/23/2021 6.450 (H) 0.450 - 4.500 uIU/mL Final         Passed - Valid encounter within last 12 months    Recent Outpatient Visits           4 months ago Annual physical exam   [facility] Myles Gip, DO   9 months ago No-show for appointment   [facility] Birdie Sons, MD   10 months ago Chronic asthma without complication, unspecified asthma severity, unspecified whether persistent   [place], DO   1 year ago Chronic asthma without complication, unspecified asthma severity, unspecified whether persistent   [facility] Birdie Sons, MD   1 year ago Pneumonia due to infectious organism, unspecified laterality, unspecified part of lung   [facility] Flinchum, Kelby Aline, FNP

## 2022-03-19 NOTE — Telephone Encounter (Signed)
Please advise if ok to call in compound thyroid medication.

## 2022-03-21 MED ORDER — LEVOTHYROXINE SODIUM 88 MCG PO TABS: 88.0000 ug | ORAL_TABLET | Freq: Every day | ORAL | 2 refills | Status: DC

## 2022-03-21 NOTE — Telephone Encounter (Signed)
Ok to dispense compounded formulation

## 2022-03-21 NOTE — Addendum Note (Signed)
Addended by: Birdie Sons on: 03/21/2022 09:21 AM   Modules accepted: Orders

## 2022-03-24 MED ORDER — LEVOTHYROXINE SODIUM 88 MCG PO TABS: 88.0000 ug | ORAL_TABLET | Freq: Every day | ORAL | 2 refills | Status: DC

## 2022-03-24 NOTE — Addendum Note (Signed)
Addended by: Julieta Bellini on: 03/24/2022 02:00 PM   Modules accepted: Orders

## 2022-03-24 NOTE — Addendum Note (Signed)
Addended by: Durwin Nora on: 03/24/2022 11:36 AM   Modules accepted: Orders

## 2022-03-24 NOTE — Telephone Encounter (Signed)
Patient concerned doesn't have this med yet because its compounded,  levothyroxine (SYNTHROID) 88 MCG  I let her know about the holiday and weekend for the holdup but still had to before the end of today. [place], [place] [facility] [place] Phone: [phone]  Fax: [phone]

## 2022-03-24 NOTE — Telephone Encounter (Signed)
Requested medication (s) are due for refill today:routing for review  Requested medication (s) are on the active medication list: yes  Last refill:  03/21/22  Future visit scheduled: yes  Notes to clinic:  Unable to refill per protocol, Rx order class is phone in. Patient states today that the pharmacy did not receive medication request on 03/21/22, routing for review,     Requested Prescriptions  Pending Prescriptions Disp Refills   levothyroxine (SYNTHROID) 88 MCG tablet 30 tablet 2    Sig: Take 1 tablet (88 mcg total) by mouth daily before breakfast. COMPOUNDED FORMULATION     Endocrinology:  Hypothyroid Agents Failed - 03/24/2022 11:36 AM      Failed - TSH in normal range and within 360 days    TSH  Date Value Ref Range Status  10/23/2021 6.450 (H) 0.450 - 4.500 uIU/mL Final         Passed - Valid encounter within last 12 months    Recent Outpatient Visits           5 months ago Annual physical exam   [facility] Myles Gip, DO   9 months ago No-show for appointment   [facility] Birdie Sons, MD   10 months ago Chronic asthma without complication, unspecified asthma severity, unspecified whether persistent   [place], DO   1 year ago Chronic asthma without complication, unspecified asthma severity, unspecified whether persistent   [facility] Birdie Sons, MD   1 year ago Pneumonia due to infectious organism, unspecified laterality, unspecified part of lung   [facility] Flinchum, Kelby Aline, FNP              Signed Prescriptions Disp Refills   levothyroxine (SYNTHROID) 88 MCG tablet 30 tablet 2    Sig: TAKE (1) [place]     Endocrinology:  Hypothyroid Agents Failed - 03/19/2022  9:20 AM      Failed - TSH in normal range and within 360 days    TSH  Date Value Ref Range Status  10/23/2021 6.450 (H) 0.450 - 4.500 uIU/mL Final          Passed - Valid encounter within last 12 months    Recent Outpatient Visits           5 months ago Annual physical exam   [facility] Myles Gip, DO   9 months ago No-show for appointment   [facility] Birdie Sons, MD   10 months ago Chronic asthma without complication, unspecified asthma severity, unspecified whether persistent   [place], [place], DO   1 year ago Chronic asthma without complication, unspecified asthma severity, unspecified whether persistent   [facility] Birdie Sons, MD   1 year ago Pneumonia due to infectious organism, unspecified laterality, unspecified part of lung   [place] Flinchum, Kelby Aline, FNP               levothyroxine (SYNTHROID) 88 MCG tablet 30 tablet 2    Sig: Take 1 tablet (88 mcg total) by mouth daily before breakfast. COMPOUNDED FORMULATION     There is no refill protocol information for this order

## 2022-04-22 ENCOUNTER — Other Ambulatory Visit: Payer: Self-pay | Admitting: Family Medicine

## 2022-04-22 DIAGNOSIS — M810 Age-related osteoporosis without current pathological fracture: Secondary | ICD-10-CM

## 2022-05-07 ENCOUNTER — Other Ambulatory Visit: Payer: Self-pay | Admitting: Family Medicine

## 2022-05-07 MED ORDER — CORAL CALCIUM-MAGNESIUM-VIT D 200-100-100 MG-MG-UNIT PO CAPS: 2.0000 | ORAL_CAPSULE | Freq: Three times a day (TID) | ORAL | 4 refills | Status: AC

## 2022-06-07 ENCOUNTER — Other Ambulatory Visit: Payer: Self-pay | Admitting: Family Medicine

## 2022-06-07 DIAGNOSIS — Z87892 Personal history of anaphylaxis: Secondary | ICD-10-CM

## 2022-06-07 DIAGNOSIS — J45909 Unspecified asthma, uncomplicated: Secondary | ICD-10-CM

## 2022-06-09 ENCOUNTER — Other Ambulatory Visit: Payer: Self-pay | Admitting: Family Medicine

## 2022-06-09 DIAGNOSIS — E039 Hypothyroidism, unspecified: Secondary | ICD-10-CM

## 2022-06-09 NOTE — Telephone Encounter (Signed)
Requested Prescriptions  Pending Prescriptions Disp Refills   albuterol (VENTOLIN HFA) 108 (90 Base) MCG/ACT inhaler [Pharmacy Med Name: ALBUTEROL HFA INH (200 PUFFS) 8.5GM] 8.5 g 5    Sig: INHALE 2 PUFFS INTO THE LUNGS EVERY 6 HOURS AS NEEDED FOR WHEEZING OR SHORTNESS OF BREATH     Pulmonology:  Beta Agonists 2 Passed - 06/07/2022  2:03 PM      Passed - Last BP in normal range    BP Readings from Last 1 Encounters:  10/31/21 96/60         Passed - Last Heart Rate in normal range    Pulse Readings from Last 1 Encounters:  10/23/21 91         Passed - Valid encounter within last 12 months    Recent Outpatient Visits           7 months ago Annual physical exam   [facility] Myles Gip, DO   12 months ago No-show for appointment   [facility] Birdie Sons, MD   1 year ago Chronic asthma without complication, unspecified asthma severity, unspecified whether persistent   [place] Myles Gip, DO   1 year ago Chronic asthma without complication, unspecified asthma severity, unspecified whether persistent   [place], Donald E, MD   1 year ago Pneumonia due to infectious organism, unspecified laterality, unspecified part of lung   [place], Kelby Aline, FNP               EPINEPHRINE 0.3 mg/0.3 mL IJ SOAJ injection [Pharmacy Med Name: EPINEPHRINE 0.3MG INJ 2 PACK] 2 each 4    Sig: INJECT 0.3 MG IN THE MUSCLE AS NEEDED FOR ANAPHYLAXIS     Immunology: Antidotes Passed - 06/07/2022  2:03 PM      Passed - Valid encounter within last 12 months    Recent Outpatient Visits           7 months ago Annual physical exam   [facility] Myles Gip, DO   12 months ago No-show for appointment   [facility] Birdie Sons, MD   1 year ago Chronic asthma  without complication, unspecified asthma severity, unspecified whether persistent   [place], [place], DO   1 year ago Chronic asthma without complication, unspecified asthma severity, unspecified whether persistent   [place] Birdie Sons, MD   1 year ago Pneumonia due to infectious organism, unspecified laterality, unspecified part of lung   [place] Flinchum, Kelby Aline, FNP

## 2022-06-10 ENCOUNTER — Telehealth: Payer: Self-pay | Admitting: Family Medicine

## 2022-06-10 NOTE — Telephone Encounter (Signed)
Yes that's fine. May compound thyroid medications.  Can add three additional refills to the prescription.

## 2022-06-10 NOTE — Telephone Encounter (Signed)
Requested Prescriptions  Pending Prescriptions Disp Refills   levothyroxine (SYNTHROID) 88 MCG tablet [Pharmacy Med Name: LEVOTHY(T4) 88MCG CAPS] 90 tablet 0    Sig: TAKE (1) CAPSULE BY MOUTH EVERY DAY BEFORE BREAKFAST.     Endocrinology:  Hypothyroid Agents Failed - 06/09/2022  9:47 AM      Failed - TSH in normal range and within 360 days    TSH  Date Value Ref Range Status  10/23/2021 6.450 (H) 0.450 - 4.500 uIU/mL Final         Passed - Valid encounter within last 12 months    Recent Outpatient Visits           7 months ago Annual physical exam   [facility] Myles Gip, DO   12 months ago No-show for appointment   [facility] Birdie Sons, MD   1 year ago Chronic asthma without complication, unspecified asthma severity, unspecified whether persistent   [place], [place], DO   1 year ago Chronic asthma without complication, unspecified asthma severity, unspecified whether persistent   [place], Donald E, MD   1 year ago Pneumonia due to infectious organism, unspecified laterality, unspecified part of lung   [place] Flinchum, Kelby Aline, FNP

## 2022-06-10 NOTE — Telephone Encounter (Signed)
Noland Fordyce with [facility] called in because they received a prescription for levothyroxine (SYNTHROID) 88 MCG  manufactured tablets and they usually do the compound version. Wants to know if they can continue on with the compound version. Please advise.

## 2022-06-18 ENCOUNTER — Encounter: Payer: Self-pay | Admitting: Family Medicine

## 2022-06-18 ENCOUNTER — Telehealth (INDEPENDENT_AMBULATORY_CARE_PROVIDER_SITE_OTHER): Payer: Self-pay | Admitting: Family Medicine

## 2022-06-18 VITALS — BP 110/73 | HR 72

## 2022-06-18 DIAGNOSIS — J45909 Unspecified asthma, uncomplicated: Secondary | ICD-10-CM

## 2022-06-18 DIAGNOSIS — F418 Other specified anxiety disorders: Secondary | ICD-10-CM

## 2022-06-18 DIAGNOSIS — K50814 Crohn's disease of both small and large intestine with abscess: Secondary | ICD-10-CM

## 2022-06-18 DIAGNOSIS — E039 Hypothyroidism, unspecified: Secondary | ICD-10-CM

## 2022-06-18 DIAGNOSIS — M810 Age-related osteoporosis without current pathological fracture: Secondary | ICD-10-CM

## 2022-06-18 MED ORDER — PREDNISONE 5 MG PO TABS: 5.0000 mg | ORAL_TABLET | Freq: Every day | ORAL | 11 refills | Status: DC

## 2022-06-18 NOTE — Progress Notes (Signed)
I,Oaklen Thiam S Shirlean Berman,acting as a Education administrator for Lelon Huh, MD.,have documented all relevant documentation on the behalf of Lelon Huh, MD,as directed by  Lelon Huh, MD while in the presence of Lelon Huh, MD.   MyChart Video Visit    Virtual Visit via Video Note   This format is felt to be most appropriate for this patient at this time. Physical exam was limited by quality of the video and audio technology used for the visit.   Patient location: home Provider location: [facility]  I discussed the limitations of evaluation and management by telemedicine and the availability of in person appointments. The patient expressed understanding and agreed to proceed.  Patient: Dawn [name]   DOB: 10/19/1965   57 y.o. Female  MRN: [id number] Visit Date: 06/18/2022  Today's healthcare provider: Lelon Huh, MD   Chief Complaint  Patient presents with   Hypothyroidism   Anxiety   Subjective    HPI  Patient requesting Prednisone 5 mg tablets to be sent to [facility]. Patient reports they compounded. Due to developing allergy to lactose monohydrate. She reports a lot a mucus, sinus drainage and sneezing.   Hypothyroid, follow-up  Lab Results  Component Value Date   TSH 6.450 (H) 10/23/2021   TSH 11.100 (H) 08/31/2019   TSH 5.870 (H) 04/23/2018   FREET4 1.44 08/31/2019   FREET4 1.48 04/23/2018   T4TOTAL 7.6 10/23/2021   T4TOTAL 6.8 01/14/2016    Wt Readings from Last 3 Encounters:  10/31/21 134 lb (60.8 kg)  10/23/21 134 lb 4.8 oz (60.9 kg)  10/21/21 133 lb (60.3 kg)    She was last seen for hypothyroid 7 months ago.  Management since that visit includes no changes. She reports excellent compliance with treatment. She is not having side effects.  Symptoms: No change in energy level No constipation  No diarrhea No heat / cold intolerance  No nervousness No palpitations  No weight changes     ----------------------------------------------------------------------------------------- Anxiety, Follow-up  She was last seen for anxiety 7 months ago. Changes made at last visit include no changes. Continue    Patient reports she has discontinued lorazepam in December.   She feels her anxiety is mild and Improved since last visit.   GAD-7 Results    06/18/2022   11:34 AM 03/04/2021    9:40 AM 08/31/2019    8:28 AM  GAD-7 Generalized Anxiety Disorder Screening Tool  1. Feeling Nervous, Anxious, or on Edge 0 1 0  2. Not Being Able to Stop or Control Worrying 0 2 1  3. Worrying Too Much About Different Things 0 1 1  4. Trouble Relaxing 0 0 0  5. Being So Restless it's Hard To Sit Still 0 0 0  6. Becoming Easily Annoyed or Irritable 0 2 1  7. Feeling Afraid As If Something Awful Might Happen 0 1 0  Total GAD-7 Score 0 7 3  Difficulty At Work, Home, or Getting  Along With Others? Not difficult at all Somewhat difficult Not difficult at all    PHQ-9 Scores    06/18/2022   11:33 AM 10/23/2021   11:14 AM 10/23/2021   11:13 AM  PHQ9 SCORE ONLY  PHQ-9 Total Score 0 0 0    ---------------------------------------------------------------------------------------------------   Medications: Outpatient Medications Prior to Visit  Medication Sig   albuterol (VENTOLIN HFA) 108 (90 Base) MCG/ACT inhaler INHALE 2 PUFFS INTO THE LUNGS EVERY 6 HOURS AS NEEDED FOR WHEEZING OR SHORTNESS OF BREATH   alendronate (FOSAMAX)  70 MG tablet TAKE 1 TABLET(70 MG) BY MOUTH 1 TIME A WEEK WITH A FULL GLASS OF WATER AND ON AN EMPTY STOMACH   budesonide-formoterol (SYMBICORT) 160-4.5 MCG/ACT inhaler Inhale 2 puffs into the lungs 2 (two) times daily.   Coral Calcium-Magnesium-Vit D 200-100-100 MG-MG-UNIT CAPS Take 2 each by mouth 3 (three) times daily.   EPINEPHRINE 0.3 mg/0.3 mL IJ SOAJ injection INJECT 0.3 MG IN THE MUSCLE AS NEEDED FOR ANAPHYLAXIS   glucose blood test strip    KETOTIFEN FUMARATE OP Take  by mouth.   NONFORMULARY OR COMPOUNDED ITEM VoThy(T4) 44mg caps. One capsule daily   [DISCONTINUED] predniSONE (DELTASONE) 5 MG tablet Take 1 tablet (5 mg total) by mouth daily.   Ferrous Sulfate (IRON SUPPLEMENT PO) Take 1 tablet by mouth daily. (Patient not taking: Reported on 06/18/2022)   levothyroxine (SYNTHROID) 88 MCG tablet TAKE (1) CAPSULE BY MOUTH EVERY DAY BEFORE BREAKFAST. (Patient not taking: Reported on 06/18/2022)   loratadine (CLARITIN) 10 MG tablet Take 10 mg by mouth 3 (three) times daily as needed for allergies. (Patient not taking: Reported on 06/18/2022)   ranitidine (ZANTAC) 75 MG tablet Take 75 mg by mouth as needed.   [DISCONTINUED] LORazepam (ATIVAN) 0.5 MG tablet TAKE 1 TABLET BY MOUTH 1 TO 2 TIMES DAILY AS NEEDED FOR ANXIETY (Patient not taking: Reported on 06/18/2022)   No facility-administered medications prior to visit.    Review of Systems  Constitutional:  Negative for appetite change, chills, fatigue and fever.  Respiratory:  Negative for chest tightness and shortness of breath.   Cardiovascular:  Negative for chest pain and palpitations.  Gastrointestinal:  Negative for abdominal pain, nausea and vomiting.  Neurological:  Negative for dizziness and weakness.       Objective    BP 110/73   Pulse 72   LMP 09/24/2017 (Approximate) Comment: Patient thinks last period was 2 or 3 years ago    Physical Exam  Awake, alert, oriented x 3. In no apparent distress   Assessment & Plan     1. Chronic asthma without complication, unspecified asthma severity, unspecified whether persistent  2. Crohn's disease of both small and large intestine with abscess ([facility]  - predniSONE (DELTASONE) 5 MG tablet; Take 1 tablet (5 mg total) by mouth daily.  Dispense: 30 tablet; Refill: 11 (compounded due to mild allergy)  3. Acquired hypothyroidism Doing well on current thyroid replacement.  - T4, free - TSH  4. Situational anxiety Doing well, no longer taking  lorazeapam.  5. Osteoporosis, unspecified osteoporosis type, unspecified pathological fracture presence  - VITAMIN D 25 Hydroxy (Vit-D Deficiency, Fractures)     I discussed the assessment and treatment plan with the patient. The patient was provided an opportunity to ask questions and all were answered. The patient agreed with the plan and demonstrated an understanding of the instructions.   The patient was advised to call back or seek an in-person evaluation if the symptoms worsen or if the condition fails to improve as anticipated.  I provided 12 minutes of non-face-to-face time during this encounter.  The entirety of the information documented in the History of Present Illness, Review of Systems and Physical Exam were personally obtained by me. Portions of this information were initially documented by the CMA and reviewed by me for thoroughness and accuracy.    DLelon Huh MD [phone](phone) [phone](fax)  [place]

## 2022-07-02 ENCOUNTER — Encounter: Payer: Self-pay | Admitting: Family Medicine

## 2022-07-02 DIAGNOSIS — E559 Vitamin D deficiency, unspecified: Secondary | ICD-10-CM | POA: Insufficient documentation

## 2022-07-02 LAB — T4, FREE: Free T4: 1.23 ng/dL (ref 0.82–1.77)

## 2022-07-02 LAB — TSH: TSH: 4.31 u[IU]/mL (ref 0.450–4.500)

## 2022-07-02 LAB — VITAMIN D 25 HYDROXY (VIT D DEFICIENCY, FRACTURES): Vit D, 25-Hydroxy: 24.9 ng/mL — ABNORMAL LOW (ref 30.0–100.0)

## 2022-07-03 ENCOUNTER — Encounter: Payer: Self-pay | Admitting: Family Medicine

## 2022-08-11 ENCOUNTER — Ambulatory Visit: Payer: Self-pay | Admitting: Family Medicine

## 2022-08-25 ENCOUNTER — Telehealth: Payer: Self-pay | Admitting: Family Medicine

## 2022-08-25 ENCOUNTER — Ambulatory Visit: Payer: 59

## 2022-08-25 NOTE — Progress Notes (Deleted)
Sleep Medicine   Office Visit  Patient Name: Dawn [name] DOB: 1966-03-09 MRN [id number]    Chief Complaint: ***  Brief History:  Dawn [name] presents with a *** history of ***. This is *** in the *** position.   Sleep quality is ***. This is noted *** nights. The patient's bed partner reports  *** at night. The patient relates the following symptoms: *** are also present. The patient goes to sleep at *** and wakes up at ***.  Sleep quality is *** when outside home environment.  Patient has noted *** of her legs at night.  The patient  relates *** behavior during the night.  The patient *** a history of psychiatric problems. The Epworth Sleepiness Score is *** out of 24 .  The patient relates  Cardiovascular risk factors include: *** The patient reports ***    ROS  General: (-) fever, (-) chills, (-) night sweat Nose and Sinuses: (-) nasal stuffiness or itchiness, (-) postnasal drip, (-) nosebleeds, (-) sinus trouble. Mouth and Throat: (-) sore throat, (-) hoarseness. Neck: (-) swollen glands, (-) enlarged thyroid, (-) neck pain. Respiratory: *** cough, *** shortness of breath, *** wheezing. Neurologic: *** numbness, *** tingling. Psychiatric: *** anxiety, *** depression Sleep behavior: ***sleep paralysis ***hypnogogic hallucinations ***dream enactment      ***vivid dreams ***cataplexy ***night terrors ***sleep walking   Current Medication: Outpatient Encounter Medications as of 08/25/2022  Medication Sig   albuterol (VENTOLIN HFA) 108 (90 Base) MCG/ACT inhaler INHALE 2 PUFFS INTO THE LUNGS EVERY 6 HOURS AS NEEDED FOR WHEEZING OR SHORTNESS OF BREATH   alendronate (FOSAMAX) 70 MG tablet TAKE 1 TABLET(70 MG) BY MOUTH 1 TIME A WEEK WITH A FULL GLASS OF WATER AND ON AN EMPTY STOMACH   budesonide-formoterol (SYMBICORT) 160-4.5 MCG/ACT inhaler Inhale 2 puffs into the lungs 2 (two) times daily.   Coral Calcium-Magnesium-Vit D 200-100-100 MG-MG-UNIT CAPS Take 2 each by mouth 3 (three) times  daily.   EPINEPHRINE 0.3 mg/0.3 mL IJ SOAJ injection INJECT 0.3 MG IN THE MUSCLE AS NEEDED FOR ANAPHYLAXIS   Ferrous Sulfate (IRON SUPPLEMENT PO) Take 1 tablet by mouth daily. (Patient not taking: Reported on 06/18/2022)   glucose blood test strip    KETOTIFEN FUMARATE OP Take by mouth.   levothyroxine (SYNTHROID) 88 MCG tablet TAKE (1) CAPSULE BY MOUTH EVERY DAY BEFORE BREAKFAST. (Patient not taking: Reported on 06/18/2022)   loratadine (CLARITIN) 10 MG tablet Take 10 mg by mouth 3 (three) times daily as needed for allergies. (Patient not taking: Reported on 06/18/2022)   NONFORMULARY OR COMPOUNDED ITEM VoThy(T4) caps. One capsule daily   predniSONE (DELTASONE) 5 MG tablet Take 1 tablet (5 mg total) by mouth daily.   ranitidine (ZANTAC) 75 MG tablet Take 75 mg by mouth as needed.   [DISCONTINUED] montelukast (SINGULAIR) 10 MG tablet Take 1 tablet (10 mg total) by mouth at bedtime.   No facility-administered encounter medications on file as of 08/25/2022.    Surgical History: Past Surgical History:  Procedure Laterality Date   COLONOSCOPY WITH PROPOFOL N/A 09/10/2020   Procedure: COLONOSCOPY WITH PROPOFOL;  Surgeon: Midge Minium, MD;  Location: [facility];  Service: Endoscopy;  Laterality: N/A;   ELECTROMYOGRAPHY  06/13/15   UE EMG Normal   SHOULDER SURGERY      Medical History: Past Medical History:  Diagnosis Date   Acquired hypothyroidism 12/11/2014   Allergy to pollen 02/23/2006   Anxiety    Asthma    Asthma, chronic 02/26/2014   Onset  as teenager    Asthma, exogenous 02/23/2006   Crohn's disease of both small and large intestine with abscess (HCC) 04/28/2005   (Ulcerative Colitis)    Fatigue 12/11/2014   GERD (gastroesophageal reflux disease)    Headache 12/11/2014   History of anaphylaxis 12/11/2014   History of insect sting allergy 02/26/2014   Hymenoptera, carries EpiPen    Hx of colonic polyps    Hx: UTI (urinary tract infection)    Mast cell disease     Osteoporosis due to androgen therapy 02/23/2006   Raynaud's phenomenon 12/11/2014   Rosacea 12/11/2014   Tobacco abuse 04/29/2003   Ulcerative colitis     Family History: Non contributory to the present illness  Social History: Social History   Socioeconomic History   Marital status: Married    Spouse name: Not on file   Number of children: 0   Years of education: Not on file   Highest education level: Not on file  Occupational History   Occupation: Inpatient Coder  Tobacco Use   Smoking status: Former    Packs/day: 0.30    Years: 10.00    Additional pack years: 0.00    Total pack years: 3.00    Types: Cigarettes    Quit date: 01/16/1995    Years since quitting: 27.6   Smokeless tobacco: Never  Substance and Sexual Activity   Alcohol use: Yes    Alcohol/week: 0.0 standard drinks of alcohol    Comment: occasional   Drug use: No   Sexual activity: Not Currently  Other Topics Concern   Not on file  Social History Narrative   Not on file   Social Determinants of Health   Financial Resource Strain: Not on file  Food Insecurity: Not on file  Transportation Needs: Not on file  Physical Activity: Not on file  Stress: Not on file  Social Connections: Not on file  Intimate Partner Violence: Not on file    Vital Signs: Last menstrual period 09/24/2017. There is no height or weight on file to calculate BMI.   Examination: General Appearance: The patient is well-developed, well-nourished, and in no distress. Neck Circumference: *** Skin: Gross inspection of skin unremarkable. Head: normocephalic, no gross deformities. Eyes: no gross deformities noted. ENT: ears appear grossly normal Neurologic: Alert and oriented. No involuntary movements.    STOP BANG RISK ASSESSMENT S (snore) Have you been told that you snore?     YES/N   T (tired) Are you often tired, fatigued, or sleepy during the day?   YES/NO  O (obstruction) Do you stop breathing, choke, or gasp during  sleep? YES/NO   P (pressure) Do you have or are you being treated for high blood pressure? YES/NO   B (BMI) Is your body index greater than 35 kg/m? YES/NO   A (age) Are you 65 years old or older? YES   N (neck) Do you have a neck circumference greater than 16 inches?   YES/NO   G (gender) Are you a female? NO   TOTAL STOP/BANG "YES" ANSWERS                                                                A STOP-Bang score of 2 or less is considered low risk, and a score  of 5 or more is high risk for having either moderate or severe OSA. For people who score 3 or 4, doctors may need to perform further assessment to determine how likely they are to have OSA.         EPWORTH SLEEPINESS SCALE:  Scale:  (0)= no chance of dozing; (1)= slight chance of dozing; (2)= moderate chance of dozing; (3)= high chance of dozing  Chance  Situtation    Sitting and reading: ***    Watching TV: ***    Sitting Inactive in public: ***    As a passenger in car: ***      Lying down to rest: ***    Sitting and talking: ***    Sitting quielty after lunch: ***    In a car, stopped in traffic: ***   TOTAL SCORE:   *** out of 24    SLEEP STUDIES:  ***   LABS: Recent Results (from the past 2160 hour(s))  T4, free     Status: None   Collection Time: 07/01/22 11:00 AM  Result Value Ref Range   Free T4 1.23 0.82 - 1.77 ng/dL  TSH     Status: None   Collection Time: 07/01/22 11:00 AM  Result Value Ref Range   TSH 4.310 0.450 - 4.500 uIU/mL  VITAMIN D 25 Hydroxy (Vit-D Deficiency, Fractures)     Status: Abnormal   Collection Time: 07/01/22 11:00 AM  Result Value Ref Range   Vit D, 25-Hydroxy 24.9 (L) 30.0 - 100.0 ng/mL    Comment: Vitamin D deficiency has been defined by the Institute of Medicine and an Endocrine Society practice guideline as a level of serum 25-OH vitamin D less than 20 ng/mL (1,2). The Endocrine Society went on to further define vitamin D insufficiency as a level  between 21 and 29 ng/mL (2). 1. IOM (Institute of Medicine). 2010. Dietary reference    intakes for calcium and D. [place]: The    [employer]. 2. Holick MF, Binkley Boone, Bischoff-Ferrari HA, et al.    Evaluation, treatment, and prevention of vitamin D    deficiency: an Endocrine Society clinical practice    guideline. JCEM. 2011 Jul; 96(7):1911-30.     Radiology: DG Bone Density  Result Date: 01/16/2022 EXAM: DUAL X-RAY ABSORPTIOMETRY (DXA) FOR BONE MINERAL DENSITY IMPRESSION: Your patient Dawn [name] completed a BMD test on 01/16/2022 using the [employer] Advance DXA System (analysis version: 14.10) manufactured by [employer]. The following summarizes the results of our evaluation. Technologist: LCE PATIENT BIOGRAPHICAL: Name: Dawn [name], Dawn [name] Patient ID:  [id number] Birth Date: Jun 19, 1965 Height:     65.0 in. Gender:      Female Exam Date:  01/16/2022 Weight:     139.4 lbs. Indications: hx steroid use, hypothyroid, asthma, Low Body Weight, Chronic Glucocorticoids, POSTmenopausal, Caucasian Fractures: Treatments: albuterol, SYMBICORT, calcium /vit d, prednisone, levothyroxine ASSESSMENT: The BMD measured at AP Spine L1-L4 (L2) is 0.688 g/cm2 with a T-score of -4.0. This patient is considered osteoporotic according to World Health Organization [facility]) criteria. Compared with the prior study on, 05/20/2018 the BMD of the total mean shows a statistically significant decrease. L-2 was excluded due to degenerative changes. The scan quality is good. Site Region Measured Measured WHO Young Adult BMD Date       Age      Classification T-score AP Spine L1-L4 (L2) 01/16/2020 58.2 Osteoporosis -4.0 0.688 g/cm2 AP Spine L1-L4 (L2) 05/20/2018 54.6 Osteoporosis -2.6 0.863 g/cm2 DualFemur Total Left 01/16/2022 58.2  Osteoporosis -3.8 0.526 g/cm2 DualFemur Total Left 05/20/2018 54.6 Osteoporosis -2.6 0.677 g/cm2 DualFemur Total Mean 01/16/2022 58.2 Osteoporosis -3.7 0.539 g/cm2 DualFemur Total  Mean 05/20/2018 54.6 Osteoporosis -2.6 0.680 g/cm2 World Health Organization [facility]) criteria for post-menopausal, Caucasian Women: Normal:       T-score at or above -1 SD Osteopenia:   T-score between -1 and -2.5 SD Osteoporosis: T-score at or below -2.5 SD RECOMMENDATIONS: 1. All patients should optimize calcium and vitamin D intake. 2. Consider FDA-approved medical therapies in postmenopausal women and men aged 6 years and older, based on the following: a. A hip or vertebral (clinical or morphometric) fracture b. T-score < -2.5 at the femoral neck or spine after appropriate evaluation to exclude secondary causes c. Low bone mass (T-score between -1.0 and -2.5 at the femoral neck or spine) and a 10-year probability of a hip fracture > 3% or a 10-year probability of a major osteoporosis-related fracture > 20% based on the [place]-adapted WHO algorithm d. Clinician judgment and/or patient preferences may indicate treatment for people with 10-year fracture probabilities above or below these levels FOLLOW-UP: People with diagnosed cases of osteoporosis or at high risk for fracture should have regular bone mineral density tests. For patients eligible for Medicare, routine testing is allowed once every 2 years. The testing frequency can be increased to one year for patients who have rapidly progressing disease, those who are receiving or discontinuing medical therapy to restore bone mass, or have additional risk factors. I have reviewed this report, and agree with the above findings. [facility] Radiology Electronically Signed   By: Frederico Hamman M.D.   On: 01/16/2022 08:29    No results found.  No results found.    Assessment and Plan: Patient Active Problem List   Diagnosis Date Noted   Vitamin D deficiency 07/02/2022   Cervical polyp 10/21/2021   Mast cell disease 10/21/2021   Pneumonia due to infectious organism 07/20/2020   History of colonic polyps 12/06/2019   Mast cell activation syndrome (HCC)  02/05/2018   Current chronic use of systemic steroids 08/18/2016   DOE (dyspnea on exertion) 11/02/2015   Bilateral headaches 11/01/2015   Weakness 11/01/2015   Arthralgia 12/11/2014   Headache 12/11/2014   History of anaphylaxis 12/11/2014   Acquired hypothyroidism 12/11/2014   Raynaud's phenomenon 12/11/2014   Rosacea 12/11/2014   Fatigue 12/11/2014   Asthma, chronic 02/26/2014   History of insect sting allergy 02/26/2014   Sjogren syndrome, unspecified (HCC) 12/27/2008   Allergy to pollen 02/23/2006   Osteoporosis 02/23/2006   Asthma, exogenous 02/23/2006   Nicotine dependence 04/29/2003   Psoriasis 12/28/1998   Situational anxiety 04/28/1998     PLAN OSA:   Patient evaluation suggests high risk of sleep disordered breathing due to *** Patient has comorbid cardiovascular risk factors including: *** which could be exacerbated by pathologic sleep-disordered breathing.  Suggest: *** to assess/treat the patient's sleep disordered breathing. The patient was also counselled on *** to optimize sleep health.  PLAN hypersomnia:  Patient evaluation suggests significant daytime hypersomnia.  The Epworth Sleepiness Score is elevated at *** out of 24. Patient *** drowsy driving. The patient *** MVA due to sleepiness.  The patient *** restless leg symptoms which exacerbate *** for *** nights per week. The patient *** periodic limb movements which exacerbate ***  for *** nights per week. Suggest: ***  Also suggest ***  PLAN insomnia:  Patient evaluation suggests *** insomnia. This is a chronic disorder. This has been a concern for ***  and causes impaired daytime functioning. The patient exhibits comorbid ***  The history *** suggest the insomnia predates the use of hypnotic medications. The symptoms *** with the discontinuation of these medications. There is no obvious medical, psychiatric or pharmacologic abuse issues ot account for the insomnia.  Treatment recommendations include: ***  The patient should maintain a sleep log and calculate total sleep time for 1-2 weeks. Set bed and wake times for achieve 85% sleep efficiency for one week. Once this is achieved  time in bed can be gradually increased. A pharmacologic treatment approach would include a trial of *** for the next ***  months. During this time the patient is to maintain a sleep diary to track progress.    ***  General Counseling: I have discussed the findings of the evaluation and examination with Dawn [name].  I have also discussed any further diagnostic evaluation thatmay be needed or ordered today. Dawn [name] verbalizes understanding of the findings of todays visit. We also reviewed her medications today and discussed drug interactions and side effects including but not limited excessive drowsiness and altered mental states. We also discussed that there is always a risk not just to her but also people around her. she has been encouraged to call the office with any questions or concerns that should arise related to todays visit.  No orders of the defined types were placed in this encounter.       I have personally obtained a history, evaluated the patient, evaluated pertinent data, formulated the assessment and plan and placed orders.    Yevonne Pax, MD [facility] Diplomate ABMS Pulmonary and Critical Care Medicine Sleep medicine

## 2022-08-25 NOTE — Telephone Encounter (Signed)
Advised we did not do this.

## 2022-08-25 NOTE — Telephone Encounter (Signed)
I don't know what this is about 

## 2022-08-25 NOTE — Telephone Encounter (Signed)
CB- [phone] Pt is calling regarding appt that was scheduled for sleep clinic. Pt states that she did not make this appt. Please advise CB- [phone]

## 2022-09-05 ENCOUNTER — Other Ambulatory Visit: Payer: Self-pay | Admitting: Family Medicine

## 2022-09-05 DIAGNOSIS — E039 Hypothyroidism, unspecified: Secondary | ICD-10-CM

## 2022-09-05 NOTE — Telephone Encounter (Signed)
Requested Prescriptions  Pending Prescriptions Disp Refills   levothyroxine (SYNTHROID) 88 MCG tablet [Pharmacy Med Name: LEVOTHY(T4) CAPS] 90 tablet 0    Sig: TAKE ONE (1) CAPSULE EACH DAY. BEFORE BREAKFAST.     Endocrinology:  Hypothyroid Agents Passed - 09/05/2022 11:33 AM      Passed - TSH in normal range and within 360 days    TSH  Date Value Ref Range Status  07/01/2022 4.310 0.450 - 4.500 uIU/mL Final         Passed - Valid encounter within last 12 months    Recent Outpatient Visits           2 months ago Chronic asthma without complication, unspecified asthma severity, unspecified whether persistent   [facility] Malva Limes, MD   10 months ago Annual physical exam   [facility] Caro Laroche, DO   1 year ago No-show for appointment   [facility] Malva Limes, MD   1 year ago Chronic asthma without complication, unspecified asthma severity, unspecified whether persistent   [facility] Caro Laroche, DO   1 year ago Chronic asthma without complication, unspecified asthma severity, unspecified whether persistent   [facility] Malva Limes, MD

## 2022-12-31 ENCOUNTER — Telehealth: Payer: Self-pay | Admitting: Family Medicine

## 2022-12-31 ENCOUNTER — Telehealth (INDEPENDENT_AMBULATORY_CARE_PROVIDER_SITE_OTHER): Payer: Managed Care, Other (non HMO) | Admitting: Family Medicine

## 2022-12-31 ENCOUNTER — Encounter: Payer: Self-pay | Admitting: Family Medicine

## 2022-12-31 DIAGNOSIS — E039 Hypothyroidism, unspecified: Secondary | ICD-10-CM

## 2022-12-31 DIAGNOSIS — W57XXXA Bitten or stung by nonvenomous insect and other nonvenomous arthropods, initial encounter: Secondary | ICD-10-CM

## 2022-12-31 DIAGNOSIS — S70369A Insect bite (nonvenomous), unspecified thigh, initial encounter: Secondary | ICD-10-CM

## 2022-12-31 MED ORDER — CLINDAMYCIN HCL 150 MG PO CAPS: 300.0000 mg | ORAL_CAPSULE | Freq: Three times a day (TID) | ORAL | 0 refills | Status: AC

## 2022-12-31 NOTE — Telephone Encounter (Signed)
[facility] is requesting prescription refill levothyroxine (SYNTHROID) 88 MCG tablet  Please advise

## 2022-12-31 NOTE — Progress Notes (Unsigned)
MyChart Video Visit    Virtual Visit via Video Note   This format is felt to be most appropriate for this patient at this time. Physical exam was limited by quality of the video and audio technology used for the visit.   Patient location: Patient's home address   Provider location: [facility]  [address]  [place], [place] [address]   I discussed the limitations of evaluation and management by telemedicine and the availability of in person appointments. The patient expressed understanding and agreed to proceed.  Patient: Dawn [name]   DOB: [birth year]   57 y.o. Female  MRN: [id number] Visit Date: 12/31/2022  Today's healthcare provider: Ronnald Ramp, MD   Chief Complaint  Patient presents with   Insect Bite    Patient reports she was bite on Aug 24 or 25th and was able to remove immediately. Believes the head was still there. Patient reports no treatment so far. Reports she started taking pictures on day 6. Patient reports sending images via mychart   Subjective    HPI HPI     Insect Bite    Additional comments: Patient reports she was bite on Aug 24 or 25th and was able to remove immediately. Believes the head was still there. Patient reports no treatment so far. Reports she started taking pictures on day 6. Patient reports sending images via mychart      Last edited by Acey Lav, CMA on 12/31/2022  1:53 PM.      Discussed the use of AI scribe software for clinical note transcription with the patient, who gave verbal consent to proceed.  History of Present Illness   The patient, with a history of multiple autoimmune diseases including mast cell activation disorder, presented with concerns about a tick bite that occurred approximately 13 days ago. She began documenting the bite with photographs on day seven due to its unusual appearance, despite the absence of itching or discomfort. However, on the night prior to the  consultation, the patient experienced pain at the site of the bite and systemic symptoms resembling the flu. She reported a low-grade fever of 100.30F, which is higher than her usual baseline of 55F. The patient described feeling unwell, with intermittent periods of feeling slightly better and then reverting to feeling unwell.  The patient also mentioned an upcoming appointment with her immunologist at [facility] for management of her mast cell disorder and a rheumatology panel. She expressed anticipation for seeing a rheumatologist and starting more aggressive treatment for her autoimmune conditions, which she described as causing a constant state of feeling unwell.  The patient requested clindamycin as a treatment option for the tick bite, citing a known tolerance to the medication and a recent development of intolerance or allergy to magnesium stearate, which is present in many medications. She now requires all her medications, including thyroid meds, prednisone, and Claritin, to be compounded due to this intolerance.   Patient reports she must have medication compounded because she is allergic to ingredient in pill        Medications: Outpatient Medications Prior to Visit  Medication Sig   albuterol (VENTOLIN HFA) 108 (90 Base) MCG/ACT inhaler INHALE 2 PUFFS INTO THE LUNGS EVERY 6 HOURS AS NEEDED FOR WHEEZING OR SHORTNESS OF BREATH   alendronate (FOSAMAX) 70 MG tablet TAKE 1 TABLET(70 MG) BY MOUTH 1 TIME A WEEK WITH A FULL GLASS OF WATER AND ON AN EMPTY STOMACH   budesonide-formoterol (SYMBICORT) 160-4.5 MCG/ACT inhaler Inhale 2  puffs into the lungs 2 (two) times daily.   Coral Calcium-Magnesium-Vit D 200-100-100 MG-MG-UNIT CAPS Take 2 each by mouth 3 (three) times daily.   EPINEPHRINE 0.3 mg/0.3 mL IJ SOAJ injection INJECT 0.3 MG IN THE MUSCLE AS NEEDED FOR ANAPHYLAXIS   Ferrous Sulfate (IRON SUPPLEMENT PO) Take 1 tablet by mouth daily.   glucose blood test strip    KETOTIFEN FUMARATE OP Take by  mouth.   levothyroxine (SYNTHROID) 88 MCG tablet TAKE ONE (1) CAPSULE EACH DAY. BEFORE BREAKFAST.   loratadine (CLARITIN) 10 MG tablet Take 10 mg by mouth 3 (three) times daily as needed for allergies.   NONFORMULARY OR COMPOUNDED ITEM VoThy(T4) caps. One capsule daily   predniSONE (DELTASONE) 5 MG tablet Take 1 tablet (5 mg total) by mouth daily.   ranitidine (ZANTAC) 75 MG tablet Take 75 mg by mouth as needed.   No facility-administered medications prior to visit.    Review of Systems  {Insert previous labs (optional):23779} {See past labs  Heme  Chem  Endocrine  Serology  Results Review (optional):1}   Objective    LMP 09/24/2017 (Approximate) Comment: Patient thinks last period was 2 or 3 years ago  BP Readings from Last 3 Encounters:  06/18/22 110/73  10/31/21 96/60  10/23/21 106/76   Wt Readings from Last 3 Encounters:  10/31/21 134 lb (60.8 kg)  10/23/21 134 lb 4.8 oz (60.9 kg)  10/21/21 133 lb (60.3 kg)    {See vitals history (optional):1}    Physical Exam Constitutional:      Appearance: Normal appearance.  Pulmonary:     Effort: Pulmonary effort is normal.  Neurological:     Mental Status: She is alert.        Assessment & Plan     Problem List Items Addressed This Visit   None Visit Diagnoses     Tick bite of thigh, unspecified laterality, initial encounter    -  Primary   Relevant Medications   clindamycin (CLEOCIN) 150 MG capsule      FAX [phone]  No follow-ups on file.         Tick Bite Documented progression of the bite site with photos. Patient reports flu-like symptoms and elevated temperature. Patient has multiple autoimmune disorders, including mast cell activation disorder, which may affect immune response. -Compound and prescribe Clindamycin for 7 days due to patient's known allergies and intolerance to other antibiotics.  Autoimmune Disorders Patient reports multiple autoimmune disorders, including mast cell  activation disorder. Patient is under the care of an immunologist and has an upcoming appointment with a rheumatologist. -Continue current management with immunologist and upcoming rheumatology consultation.  General Health Maintenance Patient has a complex medication regimen due to multiple autoimmune disorders and allergies/intolerances. All medications need to be compounded. -Ensure all future prescriptions are written with "needs to be compounded" to accommodate patient's needs.        I discussed the assessment and treatment plan with the patient. The patient was provided an opportunity to ask questions and all were answered. The patient agreed with the plan and demonstrated an understanding of the instructions.   The patient was advised to call back or seek an in-person evaluation if the symptoms worsen or if the condition fails to improve as anticipated.  I provided 16 minutes of non-face-to-face time during this encounter.   Ronnald Ramp, MD [facility] [phone] (phone) [phone] (fax)  [facility]

## 2023-01-01 ENCOUNTER — Telehealth: Payer: Self-pay | Admitting: Family Medicine

## 2023-01-01 NOTE — Telephone Encounter (Signed)
Patient call back stated the correct pharmacy is [facility] at [address] [place] [address]. Phn# [phone] and fax# [phone]

## 2023-01-01 NOTE — Telephone Encounter (Signed)
Need to verify pharmacy. LMTCB

## 2023-01-02 NOTE — Telephone Encounter (Signed)
Please see duplicate encounter on this.

## 2023-01-02 NOTE — Telephone Encounter (Signed)
I signed the faxed refill request from that pharmacy this morning.

## 2023-01-02 NOTE — Telephone Encounter (Signed)
noted 

## 2023-01-09 ENCOUNTER — Ambulatory Visit (INDEPENDENT_AMBULATORY_CARE_PROVIDER_SITE_OTHER): Payer: Managed Care, Other (non HMO) | Admitting: Family Medicine

## 2023-01-09 VITALS — BP 109/86 | HR 82 | Temp 98.6°F | Ht 65.0 in | Wt 131.3 lb

## 2023-01-09 DIAGNOSIS — D4709 Other mast cell neoplasms of uncertain behavior: Secondary | ICD-10-CM | POA: Diagnosis not present

## 2023-01-09 DIAGNOSIS — Z124 Encounter for screening for malignant neoplasm of cervix: Secondary | ICD-10-CM

## 2023-01-09 DIAGNOSIS — L409 Psoriasis, unspecified: Secondary | ICD-10-CM | POA: Diagnosis not present

## 2023-01-09 DIAGNOSIS — M3500 Sicca syndrome, unspecified: Secondary | ICD-10-CM | POA: Diagnosis not present

## 2023-01-09 DIAGNOSIS — R768 Other specified abnormal immunological findings in serum: Secondary | ICD-10-CM

## 2023-01-09 DIAGNOSIS — M2550 Pain in unspecified joint: Secondary | ICD-10-CM | POA: Diagnosis not present

## 2023-01-09 MED ORDER — VALACYCLOVIR HCL 1 G PO TABS: ORAL_TABLET | ORAL | 0 refills | Status: AC

## 2023-01-09 NOTE — Progress Notes (Unsigned)
Established patient visit   Patient: Dawn [name]   DOB: 1965-07-21   57 y.o. Female  MRN: [id number] Visit Date: 01/09/2023  Today's healthcare provider: Mila Merry, MD   No chief complaint on file.  Subjective    Discussed the use of AI scribe software for clinical note transcription with the patient, who gave verbal consent to proceed.  History of Present Illness   The patient, with a history of mast cell activation and autoimmune diseases, reports a worsening of symptoms over time. She has been under the care of an immunologist at [facility], who has been managing the mast cell activation. The patient explains that the mast cell activation triggers the autoimmune symptoms, and as the former worsens, so does the latter. She reports that the symptoms have become increasingly problematic, prompting their immunologist to conduct a rheumatology ANA panel. The results of this panel have validated the patient's concerns, showing that conditions such as Sojourns, Sica, Arthritis, and Raynaud's are progressively worsening.   She brings in recent labs from [facility] showing ANA  positive speckled pattern 1:80. Anti TPO 112.9 (n<9) and positive RF of 44 (n<14)  The patient is now seeking a referral to a rheumatologist, expressing a willingness to consider biologics as a treatment option. She had previously been hesitant due to potential long-term effects, but now feels she has nothing to lose. She hopes to see a rheumatologist sooner than the year-long wait time projected at [facility].  The patient's symptoms include severe psoriasis, vision changes, exacerbated Raynaud's, and food allergies that trigger asthma if she eats anything more than once every four to five days. She also reports symptoms of hypotension and POTS syndrome. She is currently on a maintenance dose of 5 mg prednisone, and any attempt to reduce this dose results in a significant worsening of her autoimmune symptoms.  In addition to  these symptoms, the patient has been experiencing frequent cold sores, which she believes are linked to her autoimmune flare-ups. She is interested in trying Valtrex as a treatment option for these sores.  Despite these challenges, the patient continues to exercise daily in an effort to manage her symptoms and reduce inflammation. She reports joint pain in her knuckles, elbows, knees, and hips.       Medications: Outpatient Medications Prior to Visit  Medication Sig   albuterol (VENTOLIN HFA) 108 (90 Base) MCG/ACT inhaler INHALE 2 PUFFS INTO THE LUNGS EVERY 6 HOURS AS NEEDED FOR WHEEZING OR SHORTNESS OF BREATH   alendronate (FOSAMAX) 70 MG tablet TAKE 1 TABLET(70 MG) BY MOUTH 1 TIME A WEEK WITH A FULL GLASS OF WATER AND ON AN EMPTY STOMACH   budesonide-formoterol (SYMBICORT) 160-4.5 MCG/ACT inhaler Inhale 2 puffs into the lungs 2 (two) times daily.   Coral Calcium-Magnesium-Vit D 200-100-100 MG-MG-UNIT CAPS Take 2 each by mouth 3 (three) times daily.   EPINEPHRINE 0.3 mg/0.3 mL IJ SOAJ injection INJECT 0.3 MG IN THE MUSCLE AS NEEDED FOR ANAPHYLAXIS   Ferrous Sulfate (IRON SUPPLEMENT PO) Take 1 tablet by mouth daily.   levothyroxine (SYNTHROID) 88 MCG tablet TAKE ONE (1) CAPSULE EACH DAY. BEFORE BREAKFAST.   loratadine (CLARITIN) 10 MG tablet Take 10 mg by mouth 3 (three) times daily as needed for allergies.   NONFORMULARY OR COMPOUNDED ITEM VoThy(T4) caps. One capsule daily   predniSONE (DELTASONE) 5 MG tablet Take 1 tablet (5 mg total) by mouth daily.   glucose blood test strip  (Patient not taking: Reported on 01/09/2023)  KETOTIFEN FUMARATE OP Take by mouth. (Patient not taking: Reported on 01/09/2023)   ranitidine (ZANTAC) 75 MG tablet Take 75 mg by mouth as needed. (Patient not taking: Reported on 01/09/2023)   No facility-administered medications prior to visit.   Review of Systems     Objective    BP 109/86 (BP Location: Left Arm, Patient Position: Sitting, Cuff Size: Normal)    Pulse 82   Temp 98.6 F (37 C) (Oral)   Ht 5\' 5"  (1.651 m)   Wt 131 lb 4.8 oz (59.6 kg)   LMP 09/24/2017 (Approximate) Comment: Patient thinks last period was 2 or 3 years ago  SpO2 100%   BMI 21.85 kg/m   Physical Exam  Awake, alert, oriented x 3. In no apparent distress   Assessment & Plan        Autoimmune Disorders (including Sojourns, Cica, Arthritis, Raynaud's) Worsening symptoms including psoriasis, joint pain, and food allergies. Patient is on a maintenance dose of Prednisone 5mg . Discussed the need for rheumatology consultation due to worsening symptoms and the potential use of biologics. -Refer to rheumatology for further evaluation and management.  Mast Cell Activation Syndrome Worsening symptoms, managed by Dr. Ellie Lunch at [facility]. -Continue current management plan with Dr. Ellie Lunch.  Herpes Labialis (Cold Sores) Recurrent cold sores, exacerbated by autoimmune flare-ups. -Prescribe Valtrex as needed, to be compounded due to patient's magnesium allergy.  General Health Maintenance -Continue daily low-impact exercise regimen. -Follow-up after rheumatology consultation.    No follow-ups on file.      Mila Merry, MD  [facility] [phone] (phone) [phone] (fax)  [facility]

## 2023-01-12 ENCOUNTER — Telehealth: Payer: Self-pay | Admitting: Family Medicine

## 2023-01-12 NOTE — Telephone Encounter (Signed)
Advised that we did not fax that in to them on Friday

## 2023-01-12 NOTE — Telephone Encounter (Signed)
[name] with [facility] pharmacy is calling in because they received a prescription today for clindamycin (CLEOCIN) 150 MG capsule   Misty Stanley says she wanted to know if pt is supposed to take another round of this medication and if so she would need a prescription dated for today. Misty Stanley says if it was mistake to give her a call and let her know. Phone: [phone] Fax: [phone]

## 2023-01-12 NOTE — Telephone Encounter (Signed)
This was prescribed by Dr. Roxan Hockey on 12/31/2022 with no refills.

## 2023-03-11 ENCOUNTER — Telehealth: Payer: Self-pay | Admitting: Family Medicine

## 2023-03-11 MED ORDER — DIAZEPAM 2 MG PO TABS: ORAL_TABLET | ORAL | 0 refills | Status: AC

## 2023-03-11 NOTE — Telephone Encounter (Signed)
Medication Refill -  Most Recent Primary Care Visit:  Provider: Malva Limes  Department: [facility]  Visit Type: OFFICE VISIT  Date: 01/09/2023  Medication: diazepam 5mg /reques 30 day supply  Has the patient contacted their pharmacy? no Pt thought she needed to call in because its been a while for this med and was previously prescribed by Dr Linwood Dibbles  Is this the correct pharmacy for this prescription? yes If no, delete pharmacy and type the correct one.  This is the patient's preferred pharmacy:  [id number] [place] - [address] AT [facility] OF [address] [place] [address] Phone: [phone] Fax: [phone]   Has the prescription been filled recently? no  Is the patient out of the medication? yes  Has the patient been seen for an appointment in the last year OR does the patient have an upcoming appointment? yes  Can we respond through MyChart? yes  Agent: Please be advised that Rx refills may take up to 3 business days. We ask that you follow-up with your pharmacy.

## 2023-03-11 NOTE — Telephone Encounter (Signed)
Diazepam 5mg  not on current list, patient hadn't taken medication in a while, routing for approval.

## 2023-03-16 ENCOUNTER — Other Ambulatory Visit: Payer: Self-pay | Admitting: Family Medicine

## 2023-03-16 DIAGNOSIS — K50814 Crohn's disease of both small and large intestine with abscess: Secondary | ICD-10-CM

## 2023-03-16 DIAGNOSIS — J45909 Unspecified asthma, uncomplicated: Secondary | ICD-10-CM

## 2023-03-17 NOTE — Telephone Encounter (Signed)
Requested medication (s) are due for refill today - no  Requested medication (s) are on the active medication list -yes  Future visit scheduled -no  Last refill: lorazepam- no longer listed on current medication list- non delegated Rx                  Prednisone- 06/18/22 #30 11RF- too soon- non delegated Rx Notes to clinic: see above  Requested Prescriptions  Pending Prescriptions Disp Refills   LORazepam (ATIVAN) 0.5 MG tablet [Pharmacy Med Name: LORAZEPAM 0.5MG  TABLETS] 60 tablet     Sig: TAKE 1 TABLET BY MOUTH 1 TO 2 TIMES DAILY AS NEEDED FOR ANXIETY     Not Delegated - Psychiatry: Anxiolytics/Hypnotics 2 Failed - 03/16/2023  9:15 AM      Failed - This refill cannot be delegated      Failed - Urine Drug Screen completed in last 360 days      Passed - Patient is not pregnant      Passed - Valid encounter within last 6 months    Recent Outpatient Visits           2 months ago Arthralgia, unspecified joint   [facility] Malva Limes, MD   2 months ago Tick bite of thigh, unspecified laterality, initial encounter   [facility] Simmons-Robinson, [place], MD   9 months ago Chronic asthma without complication, unspecified asthma severity, unspecified whether persistent   [facility] Malva Limes, MD   1 year ago Annual physical exam   [facility] Caro Laroche, DO   1 year ago No-show for appointment   [facility] Sherrie Mustache, Demetrios Isaacs, MD               predniSONE (DELTASONE) 5 MG tablet [Pharmacy Med Name: PREDNISONE 5MG  TABLETS] 30 tablet 11    Sig: TAKE 1 TABLET BY MOUTH ONCE DAILY.     Not Delegated - Endocrinology:  Oral Corticosteroids Failed - 03/16/2023  9:15 AM      Failed - This refill cannot be delegated      Failed - Manual Review: Eye exam for IOP if prolonged treatment      Failed - Glucose (serum) in normal range  and within 180 days    Glucose  Date Value Ref Range Status  10/23/2021 88 70 - 99 mg/dL Final  40/98/1191 90 mg/dL Final   Glucose, Bld  Date Value Ref Range Status  08/17/2017 77 70 - 99 mg/dL Final         Failed - K in normal range and within 180 days    Potassium  Date Value Ref Range Status  10/23/2021 4.1 3.5 - 5.2 mmol/L Final         Failed - Na in normal range and within 180 days    Sodium  Date Value Ref Range Status  10/23/2021 143 134 - 144 mmol/L Final         Passed - Last BP in normal range    BP Readings from Last 1 Encounters:  01/09/23 109/86         Passed - Valid encounter within last 6 months    Recent Outpatient Visits           2 months ago Arthralgia, unspecified joint   [facility] Malva Limes, MD   2 months ago Tick bite of thigh, unspecified  laterality, initial encounter   [facility] Simmons-Robinson, [place], MD   9 months ago Chronic asthma without complication, unspecified asthma severity, unspecified whether persistent   [facility] Malva Limes, MD   1 year ago Annual physical exam   [facility] Caro Laroche, DO   1 year ago No-show for appointment   [facility] Malva Limes, MD              Passed - Bone Mineral Density or Dexa Scan completed in the last 2 years         Requested Prescriptions  Pending Prescriptions Disp Refills   LORazepam (ATIVAN) 0.5 MG tablet [Pharmacy Med Name: LORAZEPAM 0.5MG  TABLETS] 60 tablet     Sig: TAKE 1 TABLET BY MOUTH 1 TO 2 TIMES DAILY AS NEEDED FOR ANXIETY     Not Delegated - Psychiatry: Anxiolytics/Hypnotics 2 Failed - 03/16/2023  9:15 AM      Failed - This refill cannot be delegated      Failed - Urine Drug Screen completed in last 360 days      Passed - Patient is not pregnant      Passed - Valid encounter within last 6 months     Recent Outpatient Visits           2 months ago Arthralgia, unspecified joint   [facility] Malva Limes, MD   2 months ago Tick bite of thigh, unspecified laterality, initial encounter   [facility] Simmons-Robinson, [place], MD   9 months ago Chronic asthma without complication, unspecified asthma severity, unspecified whether persistent   [facility] Malva Limes, MD   1 year ago Annual physical exam   [facility] Caro Laroche, DO   1 year ago No-show for appointment   [facility] Sherrie Mustache, Demetrios Isaacs, MD               predniSONE (DELTASONE) 5 MG tablet [Pharmacy Med Name: PREDNISONE 5MG  TABLETS] 30 tablet 11    Sig: TAKE 1 TABLET BY MOUTH ONCE DAILY.     Not Delegated - Endocrinology:  Oral Corticosteroids Failed - 03/16/2023  9:15 AM      Failed - This refill cannot be delegated      Failed - Manual Review: Eye exam for IOP if prolonged treatment      Failed - Glucose (serum) in normal range and within 180 days    Glucose  Date Value Ref Range Status  10/23/2021 88 70 - 99 mg/dL Final  16/01/9603 90 mg/dL Final   Glucose, Bld  Date Value Ref Range Status  08/17/2017 77 70 - 99 mg/dL Final         Failed - K in normal range and within 180 days    Potassium  Date Value Ref Range Status  10/23/2021 4.1 3.5 - 5.2 mmol/L Final         Failed - Na in normal range and within 180 days    Sodium  Date Value Ref Range Status  10/23/2021 143 134 - 144 mmol/L Final         Passed - Last BP in normal range    BP Readings from Last 1 Encounters:  01/09/23 109/86         Passed - Valid encounter within last 6 months  Recent Outpatient Visits           2 months ago Arthralgia, unspecified joint   [place] [facility] Malva Limes, MD   2 months ago Tick bite of thigh, unspecified laterality, initial  encounter   [facility] Simmons-Robinson, [place], MD   9 months ago Chronic asthma without complication, unspecified asthma severity, unspecified whether persistent   [facility] Malva Limes, MD   1 year ago Annual physical exam   [facility] Caro Laroche, DO   1 year ago No-show for appointment   [facility] Malva Limes, MD              Passed - Bone Mineral Density or Dexa Scan completed in the last 2 years

## 2023-07-20 ENCOUNTER — Encounter: Payer: Self-pay | Admitting: Family Medicine

## 2023-07-20 ENCOUNTER — Ambulatory Visit: Payer: Self-pay | Admitting: Family Medicine

## 2023-07-28 ENCOUNTER — Other Ambulatory Visit: Payer: Self-pay | Admitting: Family Medicine

## 2023-07-28 DIAGNOSIS — R918 Other nonspecific abnormal finding of lung field: Secondary | ICD-10-CM

## 2023-08-03 ENCOUNTER — Ambulatory Visit: Payer: Self-pay | Admitting: Family Medicine

## 2023-08-06 ENCOUNTER — Ambulatory Visit

## 2023-08-19 ENCOUNTER — Ambulatory Visit: Admission: RE | Admit: 2023-08-19 | Source: Ambulatory Visit

## 2023-09-11 ENCOUNTER — Ambulatory Visit

## 2023-10-12 ENCOUNTER — Ambulatory Visit
Admission: RE | Admit: 2023-10-12 | Discharge: 2023-10-12 | Disposition: A | Discharge status: Home / Self Care | Source: Ambulatory Visit | Attending: Family Medicine | Admitting: Family Medicine

## 2023-10-12 DIAGNOSIS — R918 Other nonspecific abnormal finding of lung field: Secondary | ICD-10-CM | POA: Diagnosis present

## 2023-12-09 ENCOUNTER — Telehealth (INDEPENDENT_AMBULATORY_CARE_PROVIDER_SITE_OTHER): Admitting: Family Medicine

## 2023-12-09 DIAGNOSIS — E039 Hypothyroidism, unspecified: Secondary | ICD-10-CM

## 2023-12-09 DIAGNOSIS — Z7952 Long term (current) use of systemic steroids: Secondary | ICD-10-CM

## 2023-12-09 NOTE — Progress Notes (Signed)
 MyChart Video Visit    Virtual Visit via Video Note   This format is felt to be most appropriate for this patient at this time. Physical exam was limited by quality of the video and audio technology used for the visit.   Patient location: home Provider location: [facility]  I discussed the limitations of evaluation and management by telemedicine and the availability of in person appointments. The patient expressed understanding and agreed to proceed.  Patient: Dawn [name]   DOB: Dec 01, 1965   58 y.o. Female  MRN: [id number] Visit Date: 12/09/2023  Today's healthcare provider: Nancyann Perry, MD    Subjective    HPI  Discussed the use of AI scribe software for clinical note transcription with the patient, who gave verbal consent to proceed.  History of Present Illness   Dawn [name] is a 58 year old female with hypothyroid, adrenal insufficiency and mast cell disorder who presents for medication management and scheduling of thyroid  blood work and bone scan.  She reports that an endocrinologist at [facility] determined she has adrenal insufficiency after over twenty years of prednisone  use. She developed an allergy  to prednisone  after over twenty years of use, confirmed by an endocrinologist at [facility]. She has since switched to cortisone, 10 mg, two pills twice daily, as a replacement. She is concerned about potentially developing allergies to cortisone or her thyroid  medications in the future.  She has been taking cyclosporine, 25 mg, prescribed by her mast cell allergist since October 24, 2023, without any adverse reactions, although it has not improved her condition. She has attempted to remove prednisone , ketotifen, ranitidine, and loratadine from her medication list due to allergies.  She recently consulted with an endocrinology nurse practitioner to have someone on her care team in case of future medication allergies. During this consultation, the nurse practitioner inquired about the last  thyroid  blood work and bone scan, prompting her current request to schedule these tests.     Lab Results  Component Value Date   TSH 4.310 07/01/2022     Medications: Outpatient Medications Prior to Visit  Medication Sig   cycloSPORINE modified (NEORAL) 25 MG capsule Take 25 mg by mouth 2 (two) times daily.   predniSONE  (DELTASONE ) 10 MG tablet Take 20 mg by mouth 2 (two) times daily.   albuterol  (VENTOLIN  HFA) 108 (90 Base) MCG/ACT inhaler INHALE 2 PUFFS INTO THE LUNGS EVERY 6 HOURS AS NEEDED FOR WHEEZING OR SHORTNESS OF BREATH   alendronate  (FOSAMAX ) 70 MG tablet TAKE 1 TABLET(70 MG) BY MOUTH 1 TIME A WEEK WITH A FULL GLASS OF WATER  AND ON AN EMPTY STOMACH   budesonide -formoterol  (SYMBICORT ) 160-4.5 MCG/ACT inhaler Inhale 2 puffs into the lungs 2 (two) times daily.   Coral Calcium -Magnesium -Vit D 200-100-100 MG-MG-UNIT CAPS Take 2 each by mouth 3 (three) times daily.   diazepam  (VALIUM ) 2 MG tablet Take 1/2 pill daily.   EPINEPHRINE  0.3 mg/0.3 mL IJ SOAJ injection INJECT 0.3 MG IN THE MUSCLE AS NEEDED FOR ANAPHYLAXIS   Ferrous Sulfate (IRON SUPPLEMENT PO) Take 1 tablet by mouth daily.   glucose blood test strip  (Patient not taking: Reported on 01/09/2023)   levothyroxine  (SYNTHROID ) 88 MCG tablet TAKE ONE (1) CAPSULE EACH DAY. BEFORE BREAKFAST.   NONFORMULARY OR COMPOUNDED ITEM VoThy(T4) 88mcg caps. One capsule daily   valACYclovir  (VALTREX ) 1000 MG tablet Take 2 tablets (2,000 mg) twice a day for one day at onset of cold sore. PATIENT REQUIRES THIS MEDICATION BE COMPOUNDED   [DISCONTINUED] KETOTIFEN FUMARATE  OP Take by mouth. (Patient not taking: Reported on 01/09/2023)   [DISCONTINUED] loratadine (CLARITIN) 10 MG tablet Take 10 mg by mouth 3 (three) times daily as needed for allergies.   [DISCONTINUED] LORazepam  (ATIVAN ) 0.5 MG tablet TAKE 1 TABLET BY MOUTH 1 TO 2 TIMES DAILY AS NEEDED FOR ANXIETY   [DISCONTINUED] predniSONE  (DELTASONE ) 5 MG tablet Take 1 tablet (5 mg total) by mouth  daily.   [DISCONTINUED] ranitidine (ZANTAC) 75 MG tablet Take 75 mg by mouth as needed. (Patient not taking: Reported on 01/09/2023)   No facility-administered medications prior to visit.      Objective    LMP 09/24/2017 (Approximate) Comment: Patient thinks last period was 2 or 3 years ago   Physical Exam  Awake, alert, oriented x 3. In no apparent distress   Assessment & Plan     1. Current chronic use of systemic steroids (Primary)  - DG Bone Density; Future  2. Acquired hypothyroidism  - T4 AND TSH      I discussed the assessment and treatment plan with the patient. The patient was provided an opportunity to ask questions and all were answered. The patient agreed with the plan and demonstrated an understanding of the instructions.   The patient was advised to call back or seek an in-person evaluation if the symptoms worsen or if the condition fails to improve as anticipated.  I provided 10 minutes of non-face-to-face time during this encounter.   Nancyann Perry, MD [facility] [phone] (phone) [phone] (fax)  [facility]

## 2024-03-01 ENCOUNTER — Ambulatory Visit
Admission: RE | Admit: 2024-03-01 | Discharge: 2024-03-01 | Disposition: A | Discharge status: Home / Self Care | Source: Ambulatory Visit | Attending: Family Medicine | Admitting: Family Medicine

## 2024-03-01 DIAGNOSIS — Z780 Asymptomatic menopausal state: Secondary | ICD-10-CM | POA: Diagnosis not present

## 2024-03-01 DIAGNOSIS — M810 Age-related osteoporosis without current pathological fracture: Secondary | ICD-10-CM | POA: Diagnosis not present

## 2024-03-01 DIAGNOSIS — Z7952 Long term (current) use of systemic steroids: Secondary | ICD-10-CM | POA: Diagnosis present

## 2024-03-04 ENCOUNTER — Ambulatory Visit: Payer: Self-pay | Admitting: Family Medicine

## 2024-03-04 DIAGNOSIS — M810 Age-related osteoporosis without current pathological fracture: Secondary | ICD-10-CM

## 2024-03-08 MED ORDER — ALENDRONATE SODIUM 70 MG PO TABS: 70.0000 mg | ORAL_TABLET | ORAL | 1 refills | Status: DC

## 2024-03-29 ENCOUNTER — Encounter: Payer: Self-pay | Admitting: Family Medicine

## 2024-04-01 ENCOUNTER — Encounter: Payer: Self-pay | Admitting: Family Medicine

## 2024-04-01 DIAGNOSIS — M810 Age-related osteoporosis without current pathological fracture: Secondary | ICD-10-CM

## 2024-04-01 MED ORDER — ALENDRONATE SODIUM 70 MG/75ML PO SOLN: 70.0000 mg | ORAL | 12 refills | Status: AC

## 2024-05-05 ENCOUNTER — Telehealth: Payer: Self-pay

## 2024-05-12 NOTE — Telephone Encounter (Signed)
 error
